# Patient Record
Sex: Female | Born: 1955 | Race: White | Hispanic: No | Marital: Married | Smoking: Never smoker
Health system: Southern US, Community
[De-identification: ages and names within clinical notes are randomized; demographics above are authoritative.]

## PROBLEM LIST (undated history)

## (undated) DIAGNOSIS — F419 Anxiety disorder, unspecified: Secondary | ICD-10-CM

## (undated) DIAGNOSIS — I2510 Atherosclerotic heart disease of native coronary artery without angina pectoris: Secondary | ICD-10-CM

## (undated) DIAGNOSIS — K219 Gastro-esophageal reflux disease without esophagitis: Secondary | ICD-10-CM

## (undated) DIAGNOSIS — E785 Hyperlipidemia, unspecified: Secondary | ICD-10-CM

## (undated) HISTORY — DX: Atherosclerotic heart disease of native coronary artery without angina pectoris: I25.10

## (undated) HISTORY — PX: CORONARY ARTERY BYPASS GRAFT: SHX141

## (undated) HISTORY — DX: Hyperlipidemia, unspecified: E78.5

## (undated) HISTORY — PX: OTHER SURGICAL HISTORY: SHX169

## (undated) HISTORY — PX: WISDOM TOOTH EXTRACTION: SHX21

## (undated) HISTORY — DX: Anxiety disorder, unspecified: F41.9

## (undated) HISTORY — DX: Gastro-esophageal reflux disease without esophagitis: K21.9

---

## 1998-07-10 ENCOUNTER — Other Ambulatory Visit: Admission: RE | Admit: 1998-07-10 | Discharge: 1998-07-10 | Payer: Self-pay | Admitting: Gynecology

## 1999-07-14 ENCOUNTER — Other Ambulatory Visit: Admission: RE | Admit: 1999-07-14 | Discharge: 1999-07-14 | Payer: Self-pay | Admitting: Gynecology

## 2000-08-12 ENCOUNTER — Other Ambulatory Visit: Admission: RE | Admit: 2000-08-12 | Discharge: 2000-08-12 | Payer: Self-pay | Admitting: Gynecology

## 2001-06-02 ENCOUNTER — Other Ambulatory Visit: Admission: RE | Admit: 2001-06-02 | Discharge: 2001-06-02 | Payer: Self-pay | Admitting: Gynecology

## 2001-06-11 HISTORY — PX: TUBAL LIGATION: SHX77

## 2002-07-05 ENCOUNTER — Other Ambulatory Visit: Admission: RE | Admit: 2002-07-05 | Discharge: 2002-07-05 | Payer: Self-pay | Admitting: Gynecology

## 2003-10-18 ENCOUNTER — Other Ambulatory Visit: Admission: RE | Admit: 2003-10-18 | Discharge: 2003-10-18 | Payer: Self-pay | Admitting: Gynecology

## 2005-01-11 HISTORY — PX: ROTATOR CUFF REPAIR: SHX139

## 2005-01-19 ENCOUNTER — Encounter: Admission: RE | Admit: 2005-01-19 | Discharge: 2005-01-19 | Payer: Self-pay | Admitting: Specialist

## 2005-02-04 ENCOUNTER — Ambulatory Visit: Payer: Self-pay | Admitting: Gastroenterology

## 2005-02-05 ENCOUNTER — Ambulatory Visit (HOSPITAL_COMMUNITY): Admission: RE | Admit: 2005-02-05 | Discharge: 2005-02-05 | Payer: Self-pay | Admitting: Gastroenterology

## 2005-10-01 ENCOUNTER — Other Ambulatory Visit: Admission: RE | Admit: 2005-10-01 | Discharge: 2005-10-01 | Payer: Self-pay | Admitting: Gynecology

## 2006-01-12 ENCOUNTER — Ambulatory Visit: Payer: Self-pay | Admitting: Gastroenterology

## 2006-01-14 ENCOUNTER — Ambulatory Visit: Payer: Self-pay | Admitting: Cardiology

## 2006-01-15 ENCOUNTER — Ambulatory Visit: Payer: Self-pay | Admitting: Cardiology

## 2006-01-15 ENCOUNTER — Encounter: Payer: Self-pay | Admitting: Vascular Surgery

## 2006-01-15 ENCOUNTER — Inpatient Hospital Stay (HOSPITAL_COMMUNITY): Admission: AD | Admit: 2006-01-15 | Discharge: 2006-01-23 | Payer: Self-pay | Admitting: Cardiology

## 2006-01-15 ENCOUNTER — Inpatient Hospital Stay (HOSPITAL_BASED_OUTPATIENT_CLINIC_OR_DEPARTMENT_OTHER): Admission: RE | Admit: 2006-01-15 | Discharge: 2006-01-15 | Payer: Self-pay | Admitting: Cardiology

## 2006-02-08 ENCOUNTER — Ambulatory Visit: Payer: Self-pay | Admitting: Cardiology

## 2006-02-11 ENCOUNTER — Encounter (HOSPITAL_COMMUNITY): Admission: RE | Admit: 2006-02-11 | Discharge: 2006-05-12 | Payer: Self-pay | Admitting: Cardiology

## 2006-02-11 ENCOUNTER — Encounter: Admission: RE | Admit: 2006-02-11 | Discharge: 2006-02-11 | Payer: Self-pay | Admitting: Cardiothoracic Surgery

## 2006-02-25 ENCOUNTER — Ambulatory Visit: Payer: Self-pay | Admitting: Cardiology

## 2006-03-12 ENCOUNTER — Ambulatory Visit: Payer: Self-pay | Admitting: Cardiology

## 2006-05-14 ENCOUNTER — Encounter (HOSPITAL_COMMUNITY): Admission: RE | Admit: 2006-05-14 | Discharge: 2006-06-25 | Payer: Self-pay | Admitting: Cardiology

## 2006-05-21 ENCOUNTER — Ambulatory Visit: Payer: Self-pay | Admitting: Cardiology

## 2006-06-21 ENCOUNTER — Ambulatory Visit: Payer: Self-pay | Admitting: Internal Medicine

## 2006-06-21 LAB — CONVERTED CEMR LAB
ALT: 16 units/L (ref 0–40)
AST: 19 units/L (ref 0–37)
Albumin: 3.7 g/dL (ref 3.5–5.2)
Alkaline Phosphatase: 65 units/L (ref 39–117)
BUN: 6 mg/dL (ref 6–23)
Basophils Absolute: 0 10*3/uL (ref 0.0–0.1)
Basophils Relative: 0.7 % (ref 0.0–1.0)
Bilirubin Urine: NEGATIVE
Bilirubin, Direct: 0.1 mg/dL (ref 0.0–0.3)
CO2: 29 meq/L (ref 19–32)
Calcium: 9.1 mg/dL (ref 8.4–10.5)
Chloride: 107 meq/L (ref 96–112)
Cholesterol: 133 mg/dL (ref 0–200)
Creatinine, Ser: 0.8 mg/dL (ref 0.4–1.2)
Crystals: NEGATIVE
Direct LDL: 39.1 mg/dL
Eosinophils Absolute: 0.1 10*3/uL (ref 0.0–0.6)
Eosinophils Relative: 1.2 % (ref 0.0–5.0)
GFR calc Af Amer: 98 mL/min
GFR calc non Af Amer: 81 mL/min
Glucose, Bld: 103 mg/dL — ABNORMAL HIGH (ref 70–99)
HCT: 40.5 % (ref 36.0–46.0)
HDL: 39.6 mg/dL (ref >39.0)
Hemoglobin: 14.2 g/dL (ref 12.0–15.0)
Ketones, ur: NEGATIVE mg/dL
Lymphocytes Relative: 25.7 % (ref 12.0–46.0)
MCHC: 35.2 g/dL (ref 30.0–36.0)
MCV: 86.2 fL (ref 78.0–100.0)
Monocytes Absolute: 0.4 10*3/uL (ref 0.2–0.7)
Monocytes Relative: 5.8 % (ref 3.0–11.0)
Mucus, UA: NEGATIVE
Neutro Abs: 4.2 10*3/uL (ref 1.4–7.7)
Neutrophils Relative %: 66.6 % (ref 43.0–77.0)
Nitrite: NEGATIVE
Platelets: 178 10*3/uL (ref 150–400)
Potassium: 4.4 meq/L (ref 3.5–5.1)
RBC: 4.7 M/uL (ref 3.87–5.11)
RDW: 15.9 % — ABNORMAL HIGH (ref 11.5–14.6)
Sodium: 142 meq/L (ref 135–145)
Specific Gravity, Urine: >=1.03 (ref 1.000–1.03)
TSH: 1.23 microintl units/mL (ref 0.35–5.50)
Total Bilirubin: 0.7 mg/dL (ref 0.3–1.2)
Total CHOL/HDL Ratio: 3.4
Total Protein: 6.5 g/dL (ref 6.0–8.3)
Triglycerides: 222 mg/dL (ref 0–149)
Urine Glucose: NEGATIVE mg/dL
Urobilinogen, UA: 0.2 (ref 0.0–1.0)
VLDL: 44 mg/dL — ABNORMAL HIGH (ref 0–40)
WBC: 6.3 10*3/uL (ref 4.5–10.5)
pH: 5.5 (ref 5.0–8.0)

## 2006-06-24 ENCOUNTER — Ambulatory Visit: Payer: Self-pay | Admitting: Internal Medicine

## 2006-09-15 ENCOUNTER — Other Ambulatory Visit: Admission: RE | Admit: 2006-09-15 | Discharge: 2006-09-15 | Payer: Self-pay | Admitting: Gynecology

## 2006-11-09 ENCOUNTER — Ambulatory Visit (HOSPITAL_COMMUNITY): Admission: RE | Admit: 2006-11-09 | Discharge: 2006-11-09 | Payer: Self-pay | Admitting: Gynecology

## 2006-11-23 ENCOUNTER — Ambulatory Visit: Payer: Self-pay | Admitting: Cardiology

## 2006-12-22 ENCOUNTER — Ambulatory Visit (HOSPITAL_COMMUNITY): Admission: RE | Admit: 2006-12-22 | Discharge: 2006-12-22 | Payer: Self-pay | Admitting: Gynecology

## 2007-01-10 ENCOUNTER — Ambulatory Visit: Payer: Self-pay | Admitting: Cardiology

## 2007-01-10 LAB — CONVERTED CEMR LAB: Total CK: 96 units/L (ref 7–177)

## 2007-02-12 ENCOUNTER — Encounter: Payer: Self-pay | Admitting: *Deleted

## 2007-02-12 DIAGNOSIS — Z951 Presence of aortocoronary bypass graft: Secondary | ICD-10-CM | POA: Insufficient documentation

## 2007-02-12 DIAGNOSIS — Z9189 Other specified personal risk factors, not elsewhere classified: Secondary | ICD-10-CM | POA: Insufficient documentation

## 2007-02-12 DIAGNOSIS — E785 Hyperlipidemia, unspecified: Secondary | ICD-10-CM

## 2007-02-12 DIAGNOSIS — K219 Gastro-esophageal reflux disease without esophagitis: Secondary | ICD-10-CM

## 2007-02-12 DIAGNOSIS — I2510 Atherosclerotic heart disease of native coronary artery without angina pectoris: Secondary | ICD-10-CM | POA: Insufficient documentation

## 2007-02-12 DIAGNOSIS — Z9889 Other specified postprocedural states: Secondary | ICD-10-CM | POA: Insufficient documentation

## 2007-02-12 HISTORY — DX: Hyperlipidemia, unspecified: E78.5

## 2007-02-12 HISTORY — DX: Gastro-esophageal reflux disease without esophagitis: K21.9

## 2007-03-31 ENCOUNTER — Ambulatory Visit: Payer: Self-pay | Admitting: Cardiology

## 2007-05-13 ENCOUNTER — Ambulatory Visit (HOSPITAL_COMMUNITY): Admission: RE | Admit: 2007-05-13 | Discharge: 2007-05-13 | Payer: Self-pay | Admitting: Gynecology

## 2007-05-18 ENCOUNTER — Ambulatory Visit: Payer: Self-pay | Admitting: Gastroenterology

## 2007-05-18 LAB — CONVERTED CEMR LAB
ALT: 13 units/L (ref 0–35)
AST: 17 units/L (ref 0–37)
Albumin: 3.9 g/dL (ref 3.5–5.2)
Alkaline Phosphatase: 70 units/L (ref 39–117)
Amylase: 32 units/L (ref 27–131)
Basophils Absolute: 0.1 10*3/uL (ref 0.0–0.1)
Basophils Relative: 0.9 % (ref 0.0–1.0)
Bilirubin, Direct: 0.1 mg/dL (ref 0.0–0.3)
Eosinophils Absolute: 0.1 10*3/uL (ref 0.0–0.6)
Eosinophils Relative: 1.7 % (ref 0.0–5.0)
HCT: 43.4 % (ref 36.0–46.0)
Hemoglobin: 14.1 g/dL (ref 12.0–15.0)
Lymphocytes Relative: 26.5 % (ref 12.0–46.0)
MCHC: 32.5 g/dL (ref 30.0–36.0)
MCV: 91.5 fL (ref 78.0–100.0)
Monocytes Absolute: 0.3 10*3/uL (ref 0.2–0.7)
Monocytes Relative: 6 % (ref 3.0–11.0)
Neutro Abs: 3.7 10*3/uL (ref 1.4–7.7)
Neutrophils Relative %: 64.9 % (ref 43.0–77.0)
Platelets: 149 10*3/uL — ABNORMAL LOW (ref 150–400)
RBC: 4.74 M/uL (ref 3.87–5.11)
RDW: 12.7 % (ref 11.5–14.6)
Total Bilirubin: 0.8 mg/dL (ref 0.3–1.2)
Total Protein: 6.9 g/dL (ref 6.0–8.3)
WBC: 5.7 10*3/uL (ref 4.5–10.5)

## 2007-05-20 ENCOUNTER — Ambulatory Visit (HOSPITAL_COMMUNITY): Admission: RE | Admit: 2007-05-20 | Discharge: 2007-05-20 | Payer: Self-pay | Admitting: Gastroenterology

## 2007-05-31 ENCOUNTER — Ambulatory Visit: Payer: Self-pay | Admitting: Cardiology

## 2007-05-31 LAB — CONVERTED CEMR LAB
ALT: 22 units/L (ref 0–35)
AST: 20 units/L (ref 0–37)
Albumin: 3.7 g/dL (ref 3.5–5.2)
Alkaline Phosphatase: 61 units/L (ref 39–117)
Bilirubin, Direct: 0.1 mg/dL (ref 0.0–0.3)
Cholesterol: 264 mg/dL (ref 0–200)
Direct LDL: 66.2 mg/dL
HDL: 27.8 mg/dL — ABNORMAL LOW (ref >39.0)
Total Bilirubin: 0.8 mg/dL (ref 0.3–1.2)
Total CHOL/HDL Ratio: 9.5
Total Protein: 4.9 g/dL — ABNORMAL LOW (ref 6.0–8.3)
Triglycerides: 613 mg/dL (ref 0–149)
VLDL: 123 mg/dL — ABNORMAL HIGH (ref 0–40)

## 2007-06-01 ENCOUNTER — Ambulatory Visit: Payer: Self-pay | Admitting: Gastroenterology

## 2007-06-24 ENCOUNTER — Telehealth: Payer: Self-pay | Admitting: Internal Medicine

## 2007-06-30 ENCOUNTER — Ambulatory Visit: Payer: Self-pay | Admitting: Cardiology

## 2007-06-30 ENCOUNTER — Ambulatory Visit: Payer: Self-pay | Admitting: Internal Medicine

## 2007-06-30 DIAGNOSIS — R7989 Other specified abnormal findings of blood chemistry: Secondary | ICD-10-CM | POA: Insufficient documentation

## 2007-06-30 LAB — CONVERTED CEMR LAB: Glucose, Bld: 87 mg/dL (ref 70–99)

## 2007-07-01 ENCOUNTER — Ambulatory Visit (HOSPITAL_COMMUNITY): Admission: RE | Admit: 2007-07-01 | Discharge: 2007-07-01 | Payer: Self-pay | Admitting: Gynecology

## 2007-07-02 ENCOUNTER — Encounter: Payer: Self-pay | Admitting: Internal Medicine

## 2007-07-05 ENCOUNTER — Ambulatory Visit: Payer: Self-pay | Admitting: Gastroenterology

## 2007-07-12 ENCOUNTER — Ambulatory Visit: Payer: Self-pay | Admitting: Gastroenterology

## 2007-08-19 ENCOUNTER — Ambulatory Visit: Payer: Self-pay | Admitting: Cardiology

## 2007-08-19 LAB — CONVERTED CEMR LAB
ALT: 17 units/L (ref 0–35)
AST: 19 units/L (ref 0–37)
Albumin: 3.7 g/dL (ref 3.5–5.2)
Alkaline Phosphatase: 52 units/L (ref 39–117)
BUN: 11 mg/dL (ref 6–23)
CO2: 27 meq/L (ref 19–32)
Calcium: 9.2 mg/dL (ref 8.4–10.5)
Chloride: 109 meq/L (ref 96–112)
Cholesterol: 115 mg/dL (ref 0–200)
Creatinine, Ser: 1 mg/dL (ref 0.4–1.2)
GFR calc Af Amer: 76 mL/min
GFR calc non Af Amer: 63 mL/min
Glucose, Bld: 111 mg/dL — ABNORMAL HIGH (ref 70–99)
HDL: 29.2 mg/dL — ABNORMAL LOW (ref >39.0)
LDL Cholesterol: 48 mg/dL (ref 0–99)
Potassium: 4.1 meq/L (ref 3.5–5.1)
Sodium: 143 meq/L (ref 135–145)
Total Bilirubin: 1 mg/dL (ref 0.3–1.2)
Total CHOL/HDL Ratio: 3.9
Total Protein: 6.5 g/dL (ref 6.0–8.3)
Triglycerides: 189 mg/dL — ABNORMAL HIGH (ref 0–149)
VLDL: 38 mg/dL (ref 0–40)

## 2007-08-25 ENCOUNTER — Ambulatory Visit: Payer: Self-pay | Admitting: Internal Medicine

## 2007-10-01 IMAGING — US US PELVIS COMPLETE MODIFY
1 series · 13 of 25 positions shown · non-contrast
Comparison: None.

CLINICAL DATA: Dysfunctional uterine bleeding.  Endometriosis.
 TRANSABDOMINAL AND TRANSVAGINAL PELVIC ULTRASOUND ? 11/09/06:
TECHNIQUE: Both transabdominal and transvaginal ultrasound examinations of the pelvis were performed including evaluation of the uterus, ovaries, adnexal regions, and pelvic cul-de-sac. Gray-scale and color flow Doppler evaluation was performed.

[Series 1: us pelvis complete modify · 0.35mm/px · 13 of 44 slices shown]
[im 1/44]
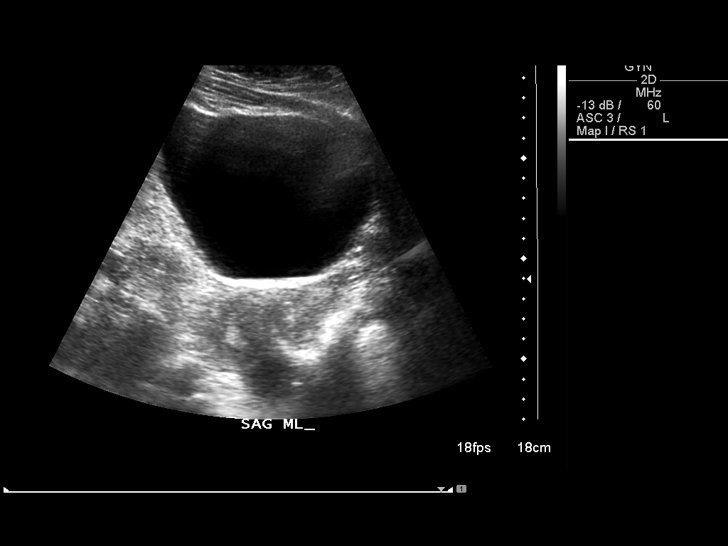
[im 4/44]
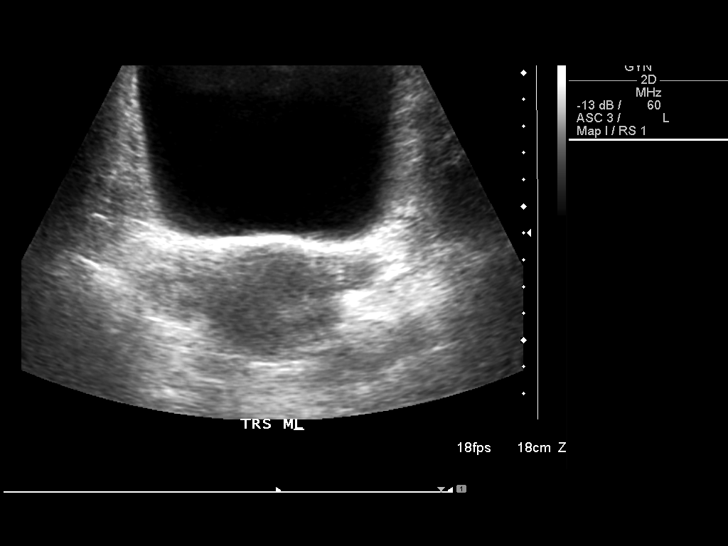
[im 8/44]
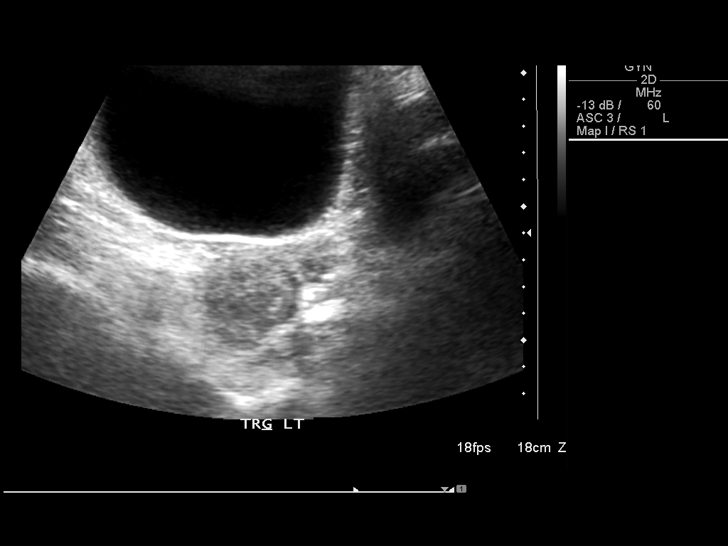
[im 11/44]
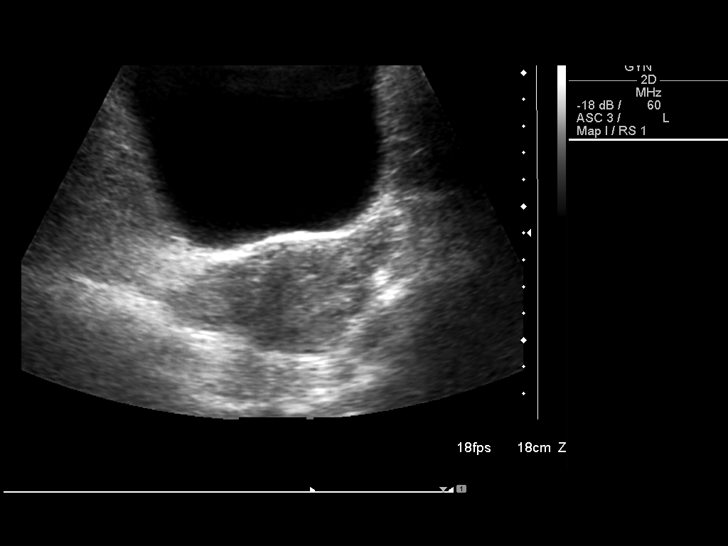
[im 15/44]
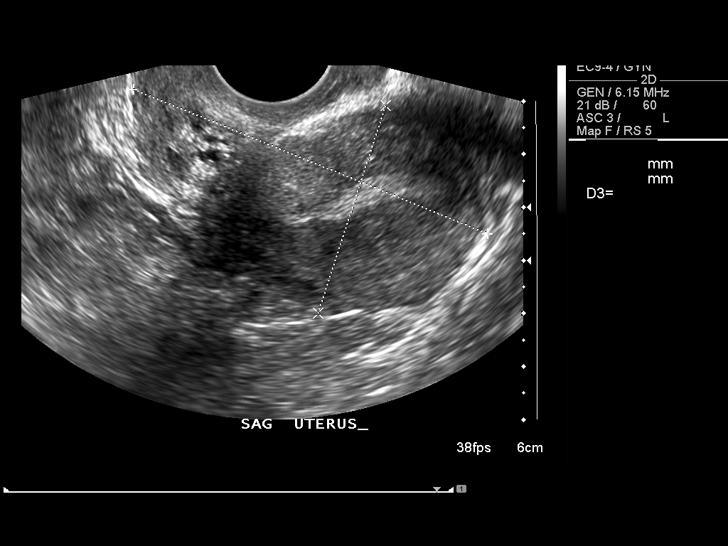
[im 18/44]
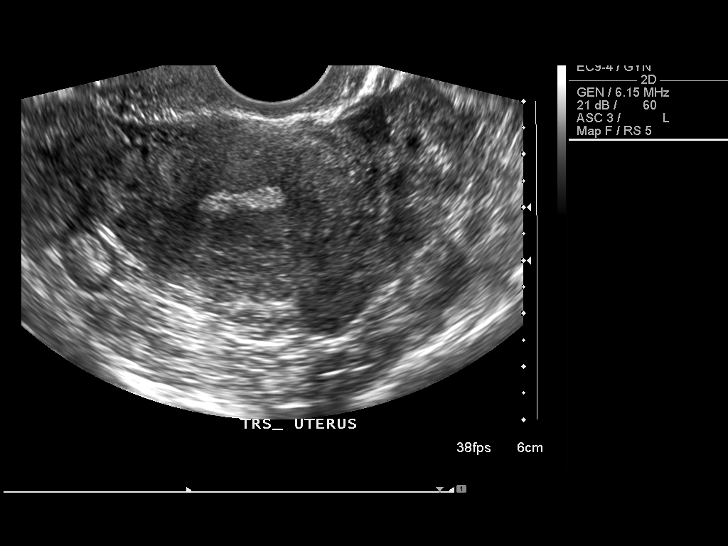
[im 22/44]
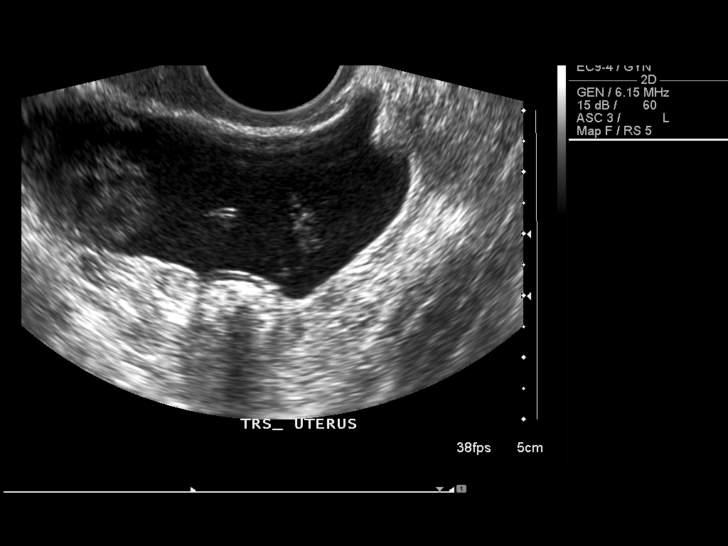
[im 26/44]
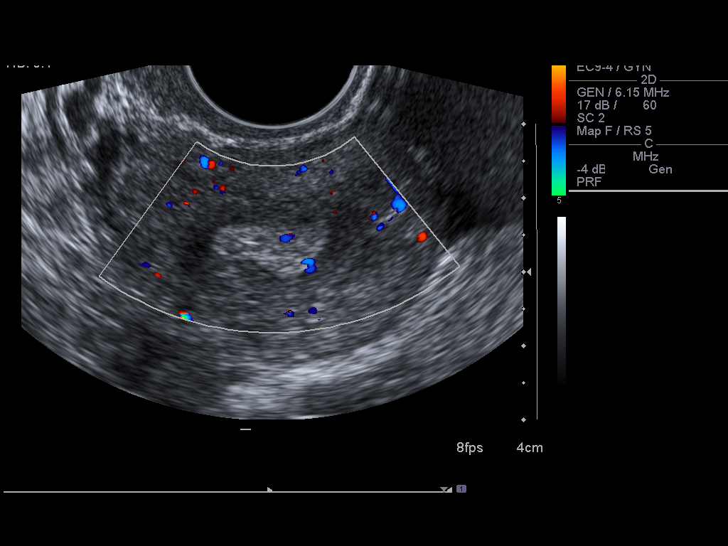
[im 29/44]
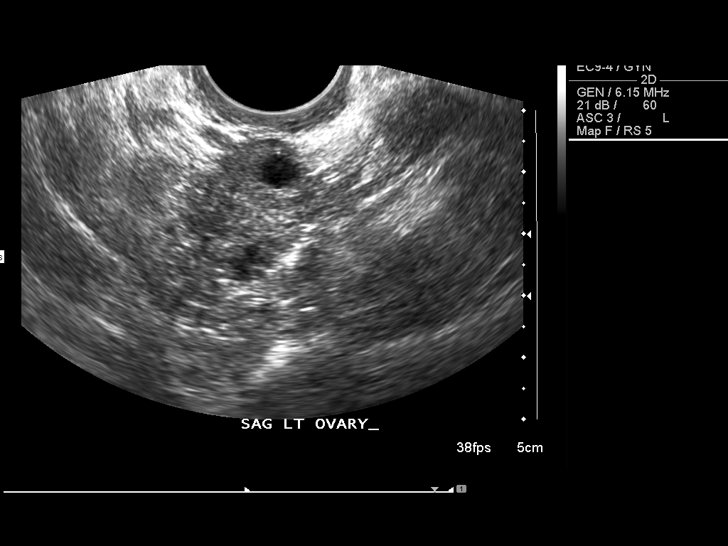
[im 33/44]
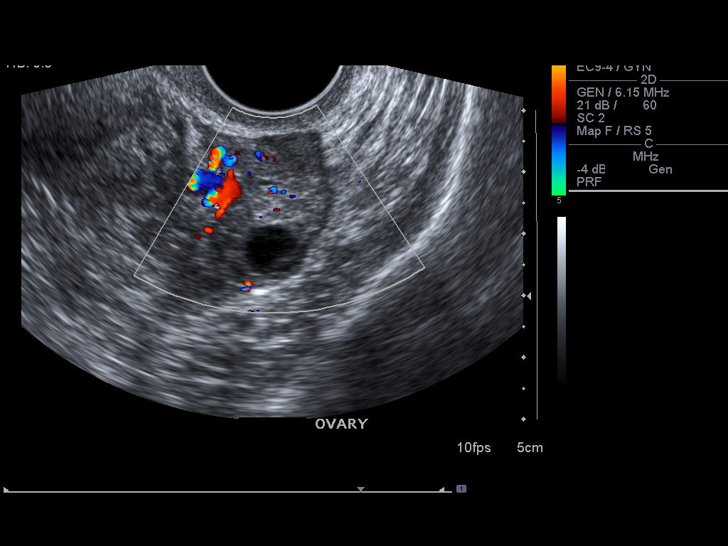
[im 36/44]
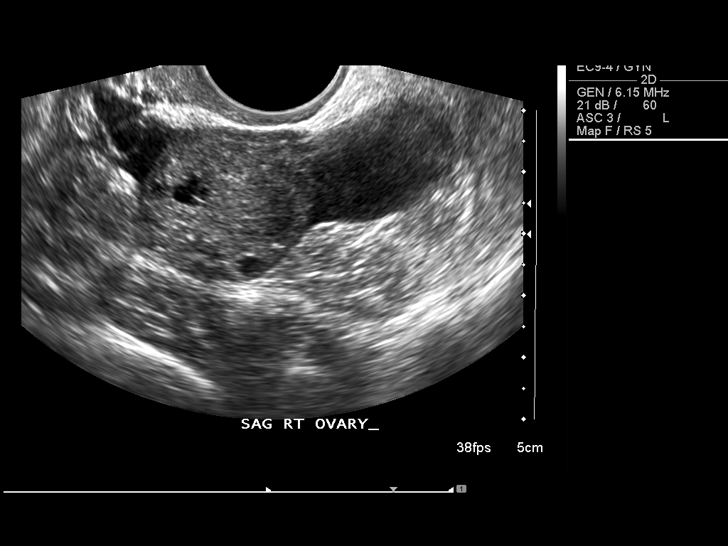
[im 40/44]
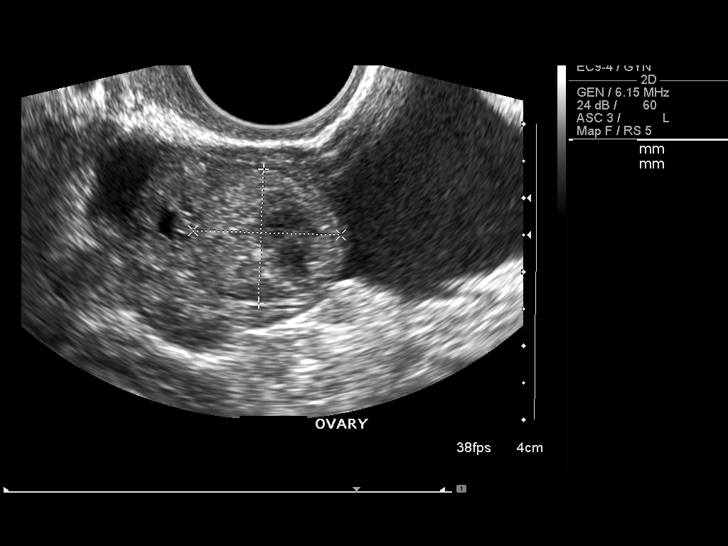
[im 44/44]
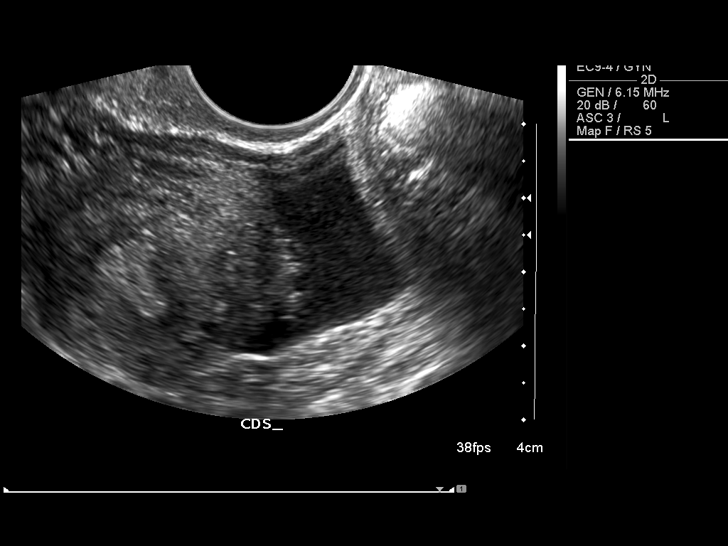

[13 of 25 positions shown; findings below may reference images not displayed]

FINDINGS: The uterus is retroverted and retroflexed.  The endometrial stripe is uniformly echogenic, measuring 9 mm.  A 1.6 x 1.6 x 1.0 cm left posterior fundal fibroid (partly subserosal, partly intramural) is present.  There is no appreciable mass effect upon the endometrial stripe.  
 The left ovary is normal.  There is a 2.0 x 1.8 x 1.6 cm thick-walled right ovarian cyst with internal echoes.  Moderate free fluid is noted in the pelvis with internal echoes present.
IMPRESSION: 1.6 cm fundal fibroid without appreciable mass effect upon the submucosa to explain the history of dysfunctional uterine bleeding.
 Thick-walled right ovarian cyst which, in the presence of complex free fluid, may signify a ruptured/hemorrhagic cyst.  Alternatively, this free fluid may be related to the patient?s history of endometriosis.

## 2007-11-21 ENCOUNTER — Ambulatory Visit: Payer: Self-pay | Admitting: Cardiology

## 2007-12-05 ENCOUNTER — Ambulatory Visit: Payer: Self-pay | Admitting: Cardiology

## 2007-12-05 LAB — CONVERTED CEMR LAB
ALT: 13 units/L (ref 0–35)
AST: 16 units/L (ref 0–37)
Albumin: 3.7 g/dL (ref 3.5–5.2)
Alkaline Phosphatase: 48 units/L (ref 39–117)
Bilirubin, Direct: 0.1 mg/dL (ref 0.0–0.3)
Cholesterol: 127 mg/dL (ref 0–200)
Direct LDL: 42.5 mg/dL
Glucose, Bld: 102 mg/dL — ABNORMAL HIGH (ref 70–99)
HDL: 26.8 mg/dL — ABNORMAL LOW (ref >39.0)
Total Bilirubin: 0.6 mg/dL (ref 0.3–1.2)
Total CHOL/HDL Ratio: 4.7
Total Protein: 6.6 g/dL (ref 6.0–8.3)
Triglycerides: 246 mg/dL (ref 0–149)
VLDL: 49 mg/dL — ABNORMAL HIGH (ref 0–40)

## 2007-12-08 ENCOUNTER — Ambulatory Visit: Payer: Self-pay | Admitting: Cardiovascular Disease

## 2008-05-08 ENCOUNTER — Other Ambulatory Visit: Admission: RE | Admit: 2008-05-08 | Discharge: 2008-05-08 | Payer: Self-pay | Admitting: Gynecology

## 2008-05-11 ENCOUNTER — Ambulatory Visit: Payer: Self-pay | Admitting: Cardiology

## 2008-05-11 LAB — CONVERTED CEMR LAB
ALT: 33 units/L (ref 0–35)
AST: 28 units/L (ref 0–37)
Albumin: 3.5 g/dL (ref 3.5–5.2)
Alkaline Phosphatase: 44 units/L (ref 39–117)
Bilirubin, Direct: 0.1 mg/dL (ref 0.0–0.3)
Cholesterol: 135 mg/dL (ref 0–200)
HDL: 42 mg/dL (ref >39.0)
LDL Cholesterol: 67 mg/dL (ref 0–99)
Total Bilirubin: 0.6 mg/dL (ref 0.3–1.2)
Total CHOL/HDL Ratio: 3.2
Total Protein: 7 g/dL (ref 6.0–8.3)
Triglycerides: 128 mg/dL (ref 0–149)
VLDL: 26 mg/dL (ref 0–40)

## 2008-05-14 ENCOUNTER — Ambulatory Visit: Payer: Self-pay | Admitting: Cardiology

## 2008-07-30 ENCOUNTER — Telehealth: Payer: Self-pay | Admitting: Cardiology

## 2008-11-08 ENCOUNTER — Ambulatory Visit: Payer: Self-pay | Admitting: Cardiology

## 2008-11-12 ENCOUNTER — Ambulatory Visit: Payer: Self-pay | Admitting: Internal Medicine

## 2008-11-16 LAB — CONVERTED CEMR LAB
ALT: 15 units/L (ref 0–35)
AST: 18 units/L (ref 0–37)
Albumin: 4 g/dL (ref 3.5–5.2)
Alkaline Phosphatase: 36 units/L — ABNORMAL LOW (ref 39–117)
Bilirubin, Direct: 0.2 mg/dL (ref 0.0–0.3)
Cholesterol: 133 mg/dL (ref 0–200)
HDL: 41.9 mg/dL (ref >39.00)
LDL Cholesterol: 70 mg/dL (ref 0–99)
Total Bilirubin: 0.9 mg/dL (ref 0.3–1.2)
Total CHOL/HDL Ratio: 3
Total Protein: 6.9 g/dL (ref 6.0–8.3)
Triglycerides: 105 mg/dL (ref 0.0–149.0)
VLDL: 21 mg/dL (ref 0.0–40.0)

## 2008-12-14 ENCOUNTER — Ambulatory Visit: Payer: Self-pay | Admitting: Cardiology

## 2009-05-15 ENCOUNTER — Ambulatory Visit: Payer: Self-pay | Admitting: Cardiology

## 2009-05-15 ENCOUNTER — Encounter (INDEPENDENT_AMBULATORY_CARE_PROVIDER_SITE_OTHER): Payer: Self-pay | Admitting: *Deleted

## 2009-05-15 LAB — CONVERTED CEMR LAB
ALT: 23 units/L (ref 0–35)
AST: 27 units/L (ref 0–37)
Albumin: 4.1 g/dL (ref 3.5–5.2)
Alkaline Phosphatase: 42 units/L (ref 39–117)
Bilirubin, Direct: 0.2 mg/dL (ref 0.0–0.3)
Cholesterol: 128 mg/dL (ref 0–200)
Glucose, Bld: 89 mg/dL (ref 70–99)
HDL: 52.9 mg/dL (ref >39.00)
LDL Cholesterol: 49 mg/dL (ref 0–99)
Total Bilirubin: 0.6 mg/dL (ref 0.3–1.2)
Total CHOL/HDL Ratio: 2
Total Protein: 6.9 g/dL (ref 6.0–8.3)
Triglycerides: 131 mg/dL (ref 0.0–149.0)
VLDL: 26.2 mg/dL (ref 0.0–40.0)

## 2009-05-16 ENCOUNTER — Ambulatory Visit: Payer: Self-pay | Admitting: Cardiology

## 2009-06-19 ENCOUNTER — Telehealth: Payer: Self-pay | Admitting: Cardiology

## 2009-08-22 ENCOUNTER — Ambulatory Visit: Payer: Self-pay | Admitting: Internal Medicine

## 2009-08-22 LAB — CONVERTED CEMR LAB
ALT: 17 units/L (ref 0–35)
AST: 24 units/L (ref 0–37)
Albumin: 4.2 g/dL (ref 3.5–5.2)
Alkaline Phosphatase: 39 units/L (ref 39–117)
Bilirubin, Direct: 0.1 mg/dL (ref 0.0–0.3)
Total Bilirubin: 0.5 mg/dL (ref 0.3–1.2)
Total CK: 108 units/L (ref 7–177)
Total Protein: 6.7 g/dL (ref 6.0–8.3)

## 2009-10-10 ENCOUNTER — Telehealth: Payer: Self-pay | Admitting: Cardiology

## 2010-05-04 ENCOUNTER — Encounter: Payer: Self-pay | Admitting: Gynecology

## 2010-05-05 ENCOUNTER — Encounter: Payer: Self-pay | Admitting: Gynecology

## 2010-05-15 NOTE — Progress Notes (Signed)
Summary: refill request  Phone Note Refill Request Message from:  Patient on October 10, 2009 11:55 AM  Refills Requested: Medication #1:  LIPITOR 10 MG TABS Take 1 tablet by mouth once a day [facility] [address]   Method Requested: Telephone to Pharmacy Initial call taken by: Glynda Jaeger,  October 10, 2009 11:56 AM  Follow-up for Phone Call        Spoke with pt about a month ago.  Muscle pain was no different without taking Lipitor.  Has seen MD and think pain is related to arthritis rather than medications.  She was going to restart her Lipitor at that time.  Rx sent in.  Follow-up by: Weston Brass PharmD,  October 10, 2009 2:04 PM

## 2010-05-15 NOTE — Letter (Signed)
Summary: Custom - Lipid  [facility], Main Office  [address]   [place], [place] [address]   Phone: [phone]  Fax: [phone]         May 15, 2009 MRN: [id number]   [name] [address] [place], [place]  [address]   Dear Ms. Keeling,  We have reviewed your cholesterol results.  They are as follows:     Total Cholesterol:    128 (Desirable: less than 200)       HDL  Cholesterol:     52.90  (Desirable: greater than 40 for men and 50 for women)       LDL Cholesterol:       49  (Desirable: less than 100 for low risk and less than 70 for moderate to high risk)       Triglycerides:       131.0  (Desirable: less than 150)  Our recommendations include: No change, this is excellent!   Call our office at the number listed above if you have any questions.  Lowering your LDL cholesterol is important, but it is only one of a large number of "risk factors" that may indicate that you are at risk for heart disease, stroke or other complications of hardening of the arteries.  Other risk factors include:   A.  Cigarette Smoking* B.  High Blood Pressure* C.  Obesity* D.   Low HDL Cholesterol (see yours above)* E.   Diabetes Mellitus (higher risk if your is uncontrolled) F.  Family history of premature heart disease G.  Previous history of stroke or cardiovascular disease           *These are risk factors YOU HAVE CONTROL OVER.  For more information, visit .  There is now evidence that lowering the TOTAL CHOLESTEROL AND LDL CHOLESTEROL can reduce the risk of heart disease.  The American Heart Association recommends the following guidelines for the treatment of elevated cholesterol:  1.  If there is now current heart disease and less than two risk factors, TOTAL CHOLESTEROL should be less than 200 and LDL CHOLESTEROL should be less than 100. 2.  If there is current heart disease or two or more risk factors, TOTAL CHOLESTEROL should  be less than 200 and LDL CHOLESTEROL should be less than 70.  A diet low in cholesterol, saturated fat, and calories is the cornerstone of treatment for elevated cholesterol.  Cessation of smoking and exercise are also important in the management of elevated cholesterol and preventing vascular disease.  Studies have shown that 30 to 60 minutes of physical activity most days can help lower blood pressure, lower cholesterol, and keep your weight at a healthy level.  Drug therapy is used when cholesterol levels do not respond to therapeutic lifestyle changes (smoking cessation, diet, and exercise) and remains unacceptably high.  If medication is started, it is important to have you levels checked periodically to evaluate the need for further treatment options.  Thank you,   Dr Rollene Rotunda, MD/Euleta Belson Volney Presser, RN [facility] Team

## 2010-05-15 NOTE — Assessment & Plan Note (Signed)
Summary: rov.[name]   Primary Provider:  Dr. Debby Bud  CC:  dyslipidemia follow-up.  History of Present Illness:  Lipid Clinic Visit      [name] comes in today for 6 mo dyslipidemia follow-up.  Her current hyperlipidemia regimen includes fenofibrate, fish oil, and Lipitor.  She is compliant and reports tolerance to all medications. Dietary compliance review reveals an overall grade of A.  She reports eating very little fast food, and eating mainly chicken and fish, salad, soups, and at least 2-3 servings of vegetables daily.  [name] chooses low-fat dairy products and drinks mainly water with an occasional soda.  Review of exercise habits reveals that the patient uses a treadmill or row-machine at least 3-5 times a week, for 30-60 minutes.      See above note regarding [name] progress and HPI.  Lipid Management Provider  [name], PharmD  Allergies: 1)  ! Codeine   Vital Signs:  Patient profile:   55 year old female BP sitting:   118 / 78  Impression & Recommendations:  Problem # 1:  DYSLIPIDEMIA (ICD-272.4) Most recent lipid panel results are as follows: TC 128 (goal <200) TG 131 (goal <150) HDL 52.9 (goal >50) LDL 49 (goal <100)  [name] has reached all lipid goals at this time.  She is compliant with medications and currently reports no adverse events.  She eats a regular and healthy diet, cosisting of low-fat food choices.  She also exercises routinely and will continue this.    Plan:  No changes at this time.  [name] will continue her current course of therapy as well as diet and exercise routines.  Follow-up has been scheduled for 6 months (august).   Her updated medication list for this problem includes:    Fenofibrate 160 Mg Tabs (Fenofibrate) .Marland Kitchen... 1 by mouth daily    Lipitor 10 Mg Tabs (Atorvastatin calcium) .Marland Kitchen... Take 1 tablet by mouth once a day

## 2010-05-15 NOTE — Assessment & Plan Note (Signed)
Summary: per pt call/having lots pain in hands,hips,joints and sometim...   Visit Type:  Follow-up  CC:  dyslipidemia follow-up.   Lipid Clinic Visit      The patient comes in today complaining of muscle aches and muscle cramps.  She states the pain is usually in the morning and occurs in her hips and feet.  She also mentioned her hands will hurt after she plays the piano.  Her mother has a history of rheumatoid arthritis.  The pain was described both as joint pain but also as aches and pain in her thigh muscles.  She does not report any dark urine at this time.  She did mention that she is premenopausal.  She did not have a mestrual cycle from September to February and did not have any problems.  Since February she has had some spotting and mestrual cycle like symptoms and wonders if the pain could be related to this.  She has an appt with her OB-GYN in the near future.  Her current cholesterol medications incule fenofibrate, fish oil, and Lipitor.  Of note, she has been on Lipitor since October 2007.   Lipid Management Provider  Weston Brass, PharmD  Current Medications (verified): 1)  Metoprolol Tartrate 25 Mg  Tabs (Metoprolol Tartrate) .... Take One Tablet Twice Daily 2)  Fenofibrate 160 Mg Tabs (Fenofibrate) .Marland Kitchen.. 1 By Mouth Daily 3)  Aspirin 81 Mg  Tabs (Aspirin) .... Take One Tablet Once Daily 4)  Centrum   Tabs (Multiple Vitamins-Minerals) .... Take 1 Tablet Daily 5)  Zegerid 40-1100 Mg  Caps (Omeprazole-Sodium Bicarbonate) .... Take When Needed and As Directed 6)  Lipitor 10 Mg Tabs (Atorvastatin Calcium) .... Take 1 Tablet By Mouth Once A Day 7)  Ra Fish Oil 1000 Mg Caps (Omega-3 Fatty Acids) .... 8 Capsules Daily.  Allergies: 1)  ! Codeine  Past History:  Past Medical History: Last updated: 12/13/2008 GASTROESOPHAGEAL REFLUX DISEASE (ICD-530.81) DYSLIPIDEMIA (ICD-272.4) CORONARY ARTERY DISEASE (ICD-414.00) FH Colon Ca     Impression & Recommendations:  Problem # 1:   DYSLIPIDEMIA (ICD-272.4) Assessment Comment Only Based on the history given, unsure if patient's muscle pains are related to her cholesterol medications.  She has been on her current therapy for years and never had any issues.  It is possible for both Lipitor and fenofibrate to be responsible for the pain and the combination may result in a higher risk for the muscle pain.  She does have a family history of arthritis and is currently perimenopausal.    For now, will stop her Lipitor and check in 2-3 weeks to determine if her pain is better.  If pain is unchanged, will plan to resume Lipitor and d/c fenofibrate.  Would really like to keep her on a statin given her history of CAD.  I have encouraged her to f/u with PCP as well as OB-GYN to determine if there is another cause for her pain.   We tested her hepatic function as well as a CK.  Both results were WNL.  Her updated medication list for this problem includes:    Fenofibrate 160 Mg Tabs (Fenofibrate) .Marland Kitchen... 1 by mouth daily    Lipitor 10 Mg Tabs (Atorvastatin calcium) .Marland Kitchen... Take 1 tablet by mouth once a day  Orders: TLB-Hepatic/Liver Function Pnl (80076-HEPATIC) TLB-CK Total Only(Creatine Kinase/CPK) (82550-CK)  Patient Instructions: 1)  Hold Lipitor for now.  We will call you in 2-3 weeks to see if you pain has improved.

## 2010-05-15 NOTE — Progress Notes (Signed)
Summary: refill**please resend**  Phone Note Refill Request Message from:  Patient on June 19, 2009 9:11 AM  Refills Requested: Medication #1:  METOPROLOL TARTRATE 25 MG  TABS Take one tablet twice daily   [facility]ed[facility] [address] **Please resend pharmacy did not receive**   Method Requested: Fax to Local Pharmacy Initial call taken by: Migdalia Dk,  June 19, 2009 9:12 AM    Prescriptions: METOPROLOL TARTRATE 25 MG  TABS (METOPROLOL TARTRATE) Take one tablet twice daily  #60 x 9   Entered by:   Marrion Coy, CNA   Authorized by:   Rollene Rotunda, MD, [facility]   Signed by:   Marrion Coy, CNA on 06/19/2009   Method used:   [facility]  [address] [id number]       [address]       [place], [place]  [address]       Ph: [phone] or [phone]       Fax: [phone]   RxID:   [id number]

## 2010-06-18 ENCOUNTER — Other Ambulatory Visit: Payer: Self-pay

## 2010-06-18 ENCOUNTER — Encounter (INDEPENDENT_AMBULATORY_CARE_PROVIDER_SITE_OTHER): Payer: Self-pay | Admitting: *Deleted

## 2010-06-18 ENCOUNTER — Other Ambulatory Visit: Payer: Self-pay | Admitting: Cardiology

## 2010-06-18 DIAGNOSIS — Z79899 Other long term (current) drug therapy: Secondary | ICD-10-CM

## 2010-06-18 LAB — LIPID PANEL
Cholesterol: 136 mg/dL (ref 0–200)
HDL: 47.8 mg/dL (ref >39.00)
LDL Cholesterol: 58 mg/dL (ref 0–99)
Total CHOL/HDL Ratio: 3
Triglycerides: 150 mg/dL — ABNORMAL HIGH (ref 0.0–149.0)
VLDL: 30 mg/dL (ref 0.0–40.0)

## 2010-06-18 LAB — HEPATIC FUNCTION PANEL
ALT: 16 U/L (ref 0–35)
AST: 22 U/L (ref 0–37)
Albumin: 4.1 g/dL (ref 3.5–5.2)
Alkaline Phosphatase: 39 U/L (ref 39–117)
Bilirubin, Direct: 0.1 mg/dL (ref 0.0–0.3)
Total Bilirubin: 0.9 mg/dL (ref 0.3–1.2)
Total Protein: 6.5 g/dL (ref 6.0–8.3)

## 2010-06-23 ENCOUNTER — Ambulatory Visit (INDEPENDENT_AMBULATORY_CARE_PROVIDER_SITE_OTHER): Payer: PRIVATE HEALTH INSURANCE

## 2010-06-23 ENCOUNTER — Encounter: Payer: Self-pay | Admitting: Cardiology

## 2010-06-23 DIAGNOSIS — E7800 Pure hypercholesterolemia, unspecified: Secondary | ICD-10-CM

## 2010-06-23 DIAGNOSIS — Z79899 Other long term (current) drug therapy: Secondary | ICD-10-CM

## 2010-06-26 ENCOUNTER — Encounter: Payer: Self-pay | Admitting: Cardiology

## 2010-06-26 ENCOUNTER — Ambulatory Visit (INDEPENDENT_AMBULATORY_CARE_PROVIDER_SITE_OTHER): Payer: PRIVATE HEALTH INSURANCE | Admitting: Cardiology

## 2010-06-26 DIAGNOSIS — I2510 Atherosclerotic heart disease of native coronary artery without angina pectoris: Secondary | ICD-10-CM

## 2010-07-01 NOTE — Letter (Signed)
Summary: Custom - Lipid  [facility], Main Office  [address]   [place], [place] [address]   Phone: [phone]  Fax: [phone]         June 23, 2010 MRN: [id number]   [name] [address] [place], [place]  [address]   Dear Ms. Duan,  We have reviewed your cholesterol results.  They are as follows:     Total Cholesterol:    136 (Desirable: less than 200)       HDL  Cholesterol:     47.80  (Desirable: greater than 40 for men and 50 for women)       LDL Cholesterol:       58  (Desirable: less than 100 for low risk and less than 70 for moderate to high risk)       Triglycerides:       150.0  (Desirable: less than 150)  Our recommendations include:    No changes - looks good!  Continue the same treatment.   Call our office at the number listed above if you have any questions.  Lowering your LDL cholesterol is important, but it is only one of a large number of "risk factors" that may indicate that you are at risk for heart disease, stroke or other complications of hardening of the arteries.  Other risk factors include:   A.  Cigarette Smoking* B.  High Blood Pressure* C.  Obesity* D.   Low HDL Cholesterol (see yours above)* E.   Diabetes Mellitus (higher risk if your is uncontrolled) F.  Family history of premature heart disease G.  Previous history of stroke or cardiovascular disease          *These are risk factors YOU HAVE CONTROL OVER.  For more information, visit .  There is now evidence that lowering the TOTAL CHOLESTEROL AND LDL CHOLESTEROL can reduce the risk of heart disease.  The American Heart Association recommends the following guidelines for the treatment of elevated cholesterol:  1.  If there is now current heart disease and less than two risk factors, TOTAL CHOLESTEROL should be less than 200 and LDL CHOLESTEROL should be less than 100. 2.  If there is current heart disease or two or more risk factors,  TOTAL CHOLESTEROL should be less than 200 and LDL CHOLESTEROL should be less than 70.  A diet low in cholesterol, saturated fat, and calories is the cornerstone of treatment for elevated cholesterol.  Cessation of smoking and exercise are also important in the management of elevated cholesterol and preventing vascular disease.  Studies have shown that 30 to 60 minutes of physical activity most days can help lower blood pressure, lower cholesterol, and keep your weight at a healthy level.  Drug therapy is used when cholesterol levels do not respond to therapeutic lifestyle changes (smoking cessation, diet, and exercise) and remains unacceptably high.  If medication is started, it is important to have you levels checked periodically to evaluate the need for further treatment options.    Thank you,     Avie Arenas, RN for [facility] Team

## 2010-07-01 NOTE — Assessment & Plan Note (Signed)
Summary: per walk in/saf   Visit Type:  Follow-up Primary Provider:  Dr. Debby Bud  CC:  CAD.  History of Present Illness: The patient presents for followup of known coronary disease. She has had a talk time in the last 12+ months with her mom being ill and other family issues. She hasn't been able to exercise as she had been. However, with her typical activities she does not have any chest pressure, neck or arm discomfort. She is not describing palpitations, presyncope or syncope. She is not mentioning PND or orthopnea. She has had no weight gain or edema.  Current Medications (verified): 1)  Metoprolol Tartrate 25 Mg  Tabs (Metoprolol Tartrate) .... Take One Tablet Twice Daily 2)  Fenofibrate 160 Mg Tabs (Fenofibrate) .Marland Kitchen.. 1 By Mouth Daily 3)  Aspirin 81 Mg  Tabs (Aspirin) .... Take One Tablet Once Daily 4)  Centrum   Tabs (Multiple Vitamins-Minerals) .... Take 1 Tablet Daily 5)  Zegerid 40-1100 Mg  Caps (Omeprazole-Sodium Bicarbonate) .... Take When Needed and As Directed 6)  Lipitor 10 Mg Tabs (Atorvastatin Calcium) .... Take 1 Tablet By Mouth Once A Day 7)  Ra Fish Oil 1000 Mg Caps (Omega-3 Fatty Acids) .... 8 Capsules Daily.  Allergies (verified): 1)  ! Codeine  Past History:  Past Medical History: Reviewed history from 12/13/2008 and no changes required. GASTROESOPHAGEAL REFLUX DISEASE (ICD-530.81) DYSLIPIDEMIA (ICD-272.4) CORONARY ARTERY DISEASE (ICD-414.00) FH Colon Ca    Past Surgical History: Reviewed history from 12/13/2008 and no changes required. TUBAL LIGATION, HX OF (ICD-V26.51) WISDOM TEETH EXTRACTION, HX OF (ICD-V15.9) ROTATOR CUFF REPAIR, RIGHT, HX OF (ICD-V45.89) * LAPAROSCOPIC SURGERY FOR ENDOMETRIOSIS CABG (status post CABG with a LIMA to the LAD     and LIMA to the right coronary artery in October 2007).  Review of Systems       As stated in the HPI and negative for all other systems.   Vital Signs:  Patient profile:   55 year old female Height:       64 inches Weight:      153 pounds BMI:     26.36 Pulse rate:   88 / minute Resp:     16 per minute BP sitting:   136 / 85  (right arm)  Vitals Entered By: Marrion Coy, CNA (June 26, 2010 12:22 PM)  Physical Exam  General:  Well developed, well nourished, in no acute distress. Head:  normocephalic and atraumatic Eyes:  PERRLA/EOM intact; conjunctiva and lids normal. Neck:  Neck supple, no JVD. No masses, thyromegaly or abnormal cervical nodes. Chest Wall:  well healed sternotomy scar Lungs:  Clear bilaterally to auscultation and percussion. Abdomen:  Bowel sounds positive; abdomen soft and non-tender without masses, organomegaly, or hernias noted. No hepatosplenomegaly. Msk:  Back normal, normal gait. Muscle strength and tone normal. Extremities:  No clubbing or cyanosis. Neurologic:  Alert and oriented x 3. Cervical Nodes:  no significant adenopathy Psych:  Normal affect.    Detailed Cardiovascular Exam  Neck    Carotids: Carotids full and equal bilaterally without bruits.      Neck Veins: Normal, no JVD.    Heart    Inspection: no deformities or lifts noted.      Palpation: normal PMI with no thrills palpable.      Auscultation: regular rate and rhythm, S1, S2 without murmurs, rubs, gallops, or clicks.    Vascular    Abdominal Aorta: no palpable masses, pulsations, or audible bruits.      Femoral Pulses:  normal femoral pulses bilaterally.      Pedal Pulses: normal pedal pulses bilaterally.      Radial Pulses: normal radial pulses bilaterally.      Peripheral Circulation: no clubbing, cyanosis, or edema noted with normal capillary refill.     EKG  Procedure date:  06/26/2010  Findings:      Sinus rhythm, rate 84, axis within normal limits, anterior T-wave inversions unchanged from, low voltage  Impression & Recommendations:  Problem # 1:  CORONARY ARTERY BYPASS GRAFT, HX OF (ICD-V45.81) She is having no new symptoms. It has been 5 years since her bypass  graft. I would like to put her on an exercise treadmill to evaluate her coronaries and most importantly gave her a prescription for exercise and she's not doing this.  Problem # 2:  DYSLIPIDEMIA (ICD-272.4)  She is being followed at the lipid clinic and has dual therapy. No change in this plan is indicated.  Her updated medication list for this problem includes:    Fenofibrate 160 Mg Tabs (Fenofibrate) .Marland Kitchen... 1 by mouth daily    Lipitor 10 Mg Tabs (Atorvastatin calcium) .Marland Kitchen... Take 1 tablet by mouth once a day  Other Orders: EKG w/ Interpretation (93000) Treadmill (Treadmill)  Patient Instructions: 1)  Your physician recommends that you schedule a follow-up appointment at the time of your treadmill 2)  Your physician recommends that you continue on your current medications as directed. Please refer to the Current Medication list given to you today. 3)  Your physician has requested that you have an exercise tolerance test.  For further information please visit [email or web address]  Please also follow instruction sheet, as given. Prescriptions: LIPITOR 10 MG TABS (ATORVASTATIN CALCIUM) Take 1 tablet by mouth once a day  #90 tablet x 3   Entered by:   Charolotte Capuchin, RN   Authorized by:   Rollene Rotunda, MD, [facility]   Signed by:   Charolotte Capuchin, RN on 06/26/2010   Method used:   Faxed to ...       [facility]       [address] [place], [place]  [address]       Ph: [phone]       Fax: [phone]   RxID:   [id number] FENOFIBRATE 160 MG TABS (FENOFIBRATE) 1 by mouth daily  #90 x 3   Entered by:   Charolotte Capuchin, RN   Authorized by:   Rollene Rotunda, MD, [facility]   Signed by:   Charolotte Capuchin, RN on 06/26/2010   Method used:   Faxed to ...       [facility]       [address] [place], [place]  [address]       Ph: [phone]       Fax: [phone]   RxID:   [id number] METOPROLOL TARTRATE 25 MG  TABS (METOPROLOL  TARTRATE) Take one tablet twice daily  #180 x 3   Entered by:   Charolotte Capuchin, RN   Authorized by:   Rollene Rotunda, MD, [facility]   Signed by:   Charolotte Capuchin, RN on 06/26/2010   Method used:   Faxed to ...       [facility]       [address] [place], [place]  [address]       Ph: [phone]       Fax: [phone]  RxID:   [id number] METOPROLOL TARTRATE 25 MG  TABS (METOPROLOL TARTRATE) Take one tablet twice daily  #60 Tablet x 0   Entered by:   Charolotte Capuchin, RN   Authorized by:   Rollene Rotunda, MD, [facility]   Signed by:   Charolotte Capuchin, RN on 06/26/2010   Method used:   [facility]        [facility]  [address] [id number]       [address]       [place], [place]  [address]       Ph: [phone] or [phone]       Fax: [phone]   RxID:   [id number]

## 2010-07-10 NOTE — Assessment & Plan Note (Signed)
Summary: rov/sp   Primary Provider:  Dr. Debby Bud   History of Present Illness: Mrs Busk presents today feeling just "ok". She has been dealing with a lot of family issues from her nephew to her mother's recent stroke. This high stress has caused her to fall away from her excercise and nutrition. She knows she needs to get back into her regular routine. She has been very compliant with her meds and remains very motivated to stay healthy.  Allergies: 1)  ! Codeine  Vital Signs:  Patient profile:   55 year old female Weight:      153.50 pounds BMI:     26.44 Pulse rate:   59 / minute BP sitting:   108 / 68  (right arm)   Impression & Recommendations:  Problem # 1:  DYSLIPIDEMIA (ICD-272.4) Latest lipid profile: Total Chol: 136 TG: 150 HDL: 47.8 LDL: 59  Counseled Mrs Golla on trying her best to get back to excercising. She admits she has fallen away from working out/healthy eating in several weeks to months. This is all 2/2 her mother's recent stroke and other family issues. I spoke with her about how excercising would also help when dealing with her stress levels.  She has had some pain that tends to fade throughout the day. Seems to be more of a arthritis type pain and not rhabdo 2/2 statin. However may consider changing statin therapy in future should it persist. Will see patient back in 6 months with visits set in September.    Her updated medication list for this problem includes:    Fenofibrate 160 Mg Tabs (Fenofibrate) .Marland Kitchen... 1 by mouth daily    Lipitor 10 Mg Tabs (Atorvastatin calcium) .Marland Kitchen... Take 1 tablet by mouth once a day Prescriptions: LIPITOR 10 MG TABS (ATORVASTATIN CALCIUM) Take 1 tablet by mouth once a day  #90 tablet x 3   Entered by:   Samantha Crimes PharmD   Authorized by:   Rollene Rotunda, MD, [facility]   Signed by:   Samantha Crimes PharmD on 06/23/2010   Method used:   [facility]  [address] [id number]       [address]       [place], [place]  [address]       Ph: [phone] or [phone]       Fax: [phone]   RxID:   [id number]

## 2010-08-06 ENCOUNTER — Encounter: Payer: PRIVATE HEALTH INSURANCE | Admitting: Cardiology

## 2010-08-11 ENCOUNTER — Other Ambulatory Visit: Payer: Self-pay | Admitting: *Deleted

## 2010-08-11 MED ORDER — FENOFIBRATE 160 MG PO TABS: 160.0000 mg | ORAL_TABLET | Freq: Every day | ORAL | Status: DC

## 2010-08-26 NOTE — Assessment & Plan Note (Signed)
[facility]                               LIPID CLINIC NOTE   NAME:[name], [name]                     MRN:          [id number]  DATE:12/08/2007                            DOB:          12-31-1955    [name] comes in today for followup of her hyperlipidemia therapy,  which includes Lipitor 10 mg daily, fenofibrate 54 mg daily, and fish  oil 1 g tablets, she takes a total of 8 capsules per day.  She has been  compliant with all 3 of these medications and tolerating them just fine.  She denies any new muscle aches or pains.  Her other medications include  metoprolol, multivitamin, aspirin 81 mg, Ambien, and Zegerid.   DIET:  She has been trying to maintain a heart-healthy diet, but admits  to having larger portion sizes.  She also reports not having nor  observing a healthy diet, as she has a nephew that has just moved into  her house and meal planning has been a little bit harder.   EXERCISE:  She is not exercising as much as she had in the past.  In the  past, she went to the gym 5 times a week and now because of the  situation in the household and some pain in her hip, she has only been  exercising 2 times a week.   PHYSICAL EXAMINATION:  Weight 163 pounds, blood pressure is 95/60, and  heart rate 60.   LABORATORY DATA:  Total cholesterol 127, triglycerides 246, HDL 26.8,  and LDL 42.5.  Liver function tests are within normal limits.   ASSESSMENT:  The patient's LDL remains at goal.  Triglycerides are  elevated above 150.  HDL is lower than ideal, the goal being greater  than 40.  In the past, the patient has had elevated fasting blood  glucoses, greater than 110.  She does not have significant history of  diabetes in her family.  She is not obese.  She sees Dr. Debby Bud as her  family practice doctor.   PLAN:  We are going to increase fenofibrate to the next dose, 160 mg  daily.  We discussed the slightly greater risk of myopathies and  hepatotoxicity with the combination of a statin and increasing the  fibrate dose.  She will call us with any problems or concerns in the  meantime.  We discussed with her increasing amount of exercise she is  getting per week and continuing to try to observe a heart-healthy diet  and reducing portion sizes.  I believe because of her elevated  triglycerides and elevated fasting glucoses in the past, she may have  some prediabetes.  We will leave followup of that issue with her regular  doctor.  Followup with [place] will be in 3 months, at which time we will  draw a lipid and liver panel.  Prescription was sent electronically to  her to  [facility] on [address].  This patient was seen along with [name], [facility] PharmD candidate.      Charolotte Eke, PharmD  Electronically Signed  Rollene Rotunda, MD, [facility]  Electronically Signed   TP/MedQ  DD: 12/08/2007  DT: 12/09/2007  Job #: [id number]   cc:   Rosalyn Gess. Norins, MD

## 2010-08-26 NOTE — Assessment & Plan Note (Signed)
[facility]                            CARDIOLOGY OFFICE NOTE   NAME:[name], [name]                     MRN:          [id number]  DATE:03/31/2007                            DOB:          15-Jul-1955    PRIMARY CARE PHYSICIAN:  Dr. Debby Bud.   REASON FOR PRESENTATION:  Evaluate patient with coronary disease and  recurrent chest pain.   HISTORY OF PRESENT ILLNESS:  The patient had herself added to the  schedule because she has had some recurrent chest discomfort.  She has  had a lot of stress as her father-in-law in [place] is critically ill  after breaking a hip.  They have been traveling back and forth.  She has  noticed chest discomfort. This has been substernal.  It is similar to  her previous GI complaints.  It is not similar to the angina that she  had previously which was more a bilateral shoulder ache.  It happens  every time she eats and does happen sometimes at night as well.  However, it is not with activity such as rushing around or doing routine  chores. She does not have any jaw discomfort.  There is no associated  diaphoresis or shortness of breath. There may be some nausea. She was  previously on Zegerid but has run out of this.  She thought this helped  in the past.  She wanted to make sure that this did not sound like an  anginal equivalent.  But again she does not think it is like her  previous pain that did go away after bypass.  This has never returned.   The patient has had no fevers, chills, or cough. She has had no PND or  orthopnea.   PAST MEDICAL HISTORY:  1. Coronary artery disease (LAD occluded, circumflex with small ramus      branch and a large posterolateral branch, diagonal branch filling      the circumflex with collaterals, the right coronary artery had 90%      proximal stenosis, an EF of 60% with hypokinesis of the apex.  The      patient had a CABG with a LIMA to the LAD and RIMA to the right      coronary  artery).  2. Dyslipidemia.  3. Gastroesophageal reflux disease.  4. Laparoscopic surgery for endometriosis.  5. Shoulder surgery.  6. Oral surgery (patient recently had dental implant and has had some      jaw pain and is taking pain medications).   ALLERGIES:  CODEINE.   MEDICATIONS:  Metoprolol 25 mg b.i.d., aspirin 81 mg daily, Centrum,  fish oil, Prilosec, doxycycline (patient stopped Lipitor and fenofibrate  after getting aches and pains on a higher dose of fenofibrate when we  increased this).   REVIEW OF SYSTEMS:  Positive for difficulty sleeping, and stress.   PHYSICAL EXAMINATION:  The patient is in no distress with blood pressure  108/74, heart rate 74, weight 173 pounds, body mass 28.  HEENT:  Eyes unremarkable, pupils are equal, round and reactive to  light, fundi not visualized, oral mucosa unremarkable.  NECK:  No jugular venous distension at 45 degrees, carotid upstroke  brisk and symmetric, no bruits, no thyromegaly.  LYMPHATICS:  No cervical, axillary, inguinal adenopathy.  LUNGS:  Clear to auscultation bilaterally.  BACK:  No costovertebral angle tenderness.  CHEST:  Well-healed sternotomy scar.  HEART:  PMI not displaced or sustained, S1 and S2 within normal limits,  no S3, no S4, no click, no rubs, no murmurs.  ABDOMEN:  Flat, positive bowel sounds, normal in frequency and pitch, no  bruits, no rebound, no guarding, no midline pulsatile mass, no  lymphadenopathy, no splenomegaly.  SKIN:  No rash, no nodules.  EXTREMITIES:  Two+ pulses, no edema, no cyanosis, no clubbing.  NEURO:  Oriented to person, place, time, cranial nerves II-XII grossly  intact, motor grossly intact.   EKG:  Sinus rhythm, rate 70, old anterior septal infarct, no change from  previous EKGs.   ASSESSMENT AND PLAN:  1. Chest discomfort.  This chest discomfort is atypical for her      angina. I do think it is GI. I happens reproducibly with meals. At      this point I do not think  further cardiovascular testing is      suggested but she should be back on Zegerid. I have written a      prescription for this.  2. Dyslipidemia. She did not tolerate the higher dose of fenofibrate      at 160 mg in combination with Lipitor. However, she did tolerate      Lipitor 10 with fenofibrate 54 and I will restart this combination.      She needs lipids and liver in 8 weeks.  She will let me know if the      has any aches and pains with this.  3. Gastroesophageal reflux disease as above.  4. Followup.  She is due to come back in August and will keep this      appointment though she will get the blood work in 2 weeks.     Rollene Rotunda, MD, [facility]  Electronically Signed    JH/MedQ  DD: 03/31/2007  DT: 03/31/2007  Job #: [id number]   cc:   Rosalyn Gess. Norins, MD

## 2010-08-26 NOTE — Assessment & Plan Note (Signed)
[facility]                         GASTROENTEROLOGY OFFICE NOTE   NAME:[name], [name]                     MRN:          [id number]  DATE:05/18/2007                            DOB:          [birth year]    PROBLEM:  Abdominal discomfort.   [name] has returned for re-evaluation.  She is complaining of  postprandial abdominal fullness and discomfort with severe eructations.  She has discomfort under the right shoulder blade that is relieved with  belching.  Symptoms continue despite Zegerid.  Discomfort also radiates  into the chest, but is nonexertional.  On recent cardiac evaluation, it  was not felt that her chest discomfort was due to cardiac ischemia.  [name] 53 year old brother was recently diagnosed with colon  and rectal cancer.  She has no lower GI complaints.   MEDICATIONS:  Include Lipitor, baby aspirin, Zegerid, metoprolol, and  Mylanta.   ALLERGIES:  She is allergic to CODEINE.   PHYSICAL EXAMINATION:  Pulse 64, blood pressure 120/70, weight 172.  HEENT: EOMI.  PERRLA.  Sclerae are anicteric.  Conjunctivae are pink.  NECK:  Supple without thyromegaly, adenopathy or carotid bruits.  CHEST:  Clear to auscultation and percussion without adventitious  sounds.  CARDIAC:  Regular rhythm; normal S1 S2.  There are no murmurs, gallops  or rubs.  ABDOMEN:  Bowel sounds are normoactive.  Abdomen is soft, nontender and  nondistended.  There are no abdominal masses, tenderness, splenic  enlargement or hepatomegaly.  EXTREMITIES:  Full range of motion.  No cyanosis, clubbing or edema.  RECTAL:  Deferred.   IMPRESSION:  1. Postprandial abdominal and chest discomfort.  The symptoms may be      due to non-ulcer dyspepsia or GERD, despite PPI therapy.  Chronic      cholecystitis is also another concern.  2. Family history of colorectal CA.   RECOMMENDATION:  1. Abdominal ultrasound.  2. Antacids p.r.n. while continuing  Zegerid.  3. To consider upper endoscopy, if ultrasound is not diagnostic.  4. Screening colonoscopy.     Barbette Hair. Arlyce Dice, MD,FACG  Electronically Signed   RDK/MedQ  DD: 05/18/2007  DT: 05/18/2007  Job #: [id number]   cc:   Rollene Rotunda, MD, [facility]

## 2010-08-26 NOTE — Assessment & Plan Note (Signed)
[facility]                               LIPID CLINIC NOTE   NAME:Ulrey, Naomie Demetrius Charity                     MRN:          [id number]  DATE:06/30/2007                            DOB:          [birth year]    FIRST OFFICE VISIT FOR LIPID CLINIC.   PAST MEDICAL HISTORY:  Hyperlipidemia, coronary artery disease status  post coronary artery bypass graft x2, preserved LV function,  gastroesophageal reflux disease.   MEDICATIONS:  1. Lipitor 10 mg daily.  2. Aspirin 81 mg daily.  3. Fenofibrate 54 mg daily.  4. Zegerid 40 mg daily.  5. Multivitamin daily.  6. Metoprolol 50 mg daily.  7. Mylanta as needed.  8. Fish oil 1 gram twice daily.   VITAL SIGNS:  Weight 167 pounds, blood pressure 110/70, heart rate 80.   LABORATORY DATA:  Total cholesterol 264, triglyceride 613, HDL 28, LDL  62.  LFTs within normal limits.   ASSESSMENT:  [name] is a very pleasant fit 55 year old female who  comes to the Lipid Clinic today for her first visit referred by her  cardiologist, Dr. Antoine Poche.  She had recently she has been on Lipitor 10  mg daily and fenofibrate 54 mg daily.  Last fall she had triglycerides  still greater than goal in the 200s and was increased to fenofibrate 160  at that time.  Shortly after that dose increase, she had increase in  myalgias, muscle aches, pains, although there were no documentation of  increased CK or LFTs at that time.  She was instructed to stop the  medications and return for followup.  At the time of her myalgias, her  CK was within normal limits at 96.  Upon followup visit, she was  instructed to restart for Lipitor at her previous dose of 10 mg a day  but to remain off of fenofibrate for the time being.  After restarting  her Lipitor for her LDL was at goal of less than 70 her total  cholesterol was greater than goal of less than 200, triglycerides  greater than goal of less than 150, HDL less than goal of greater than  40.  Upon discussion with Ms. Vandevoort, she says that over the last 6  months to year that she has decreased her compliance with her heart  healthy diet and her exercise regimen.  Earlier this year, her father-in-  law fell, broke his hip, was injured, and has subsequently passed.  It  has been a long-haul back to the [place] for her and her family, and  has put her normal routine on hold.  Since her return after his death  and her results of triglycerides of 600, she says that now she has a  regained sense of urgency to restart her exercise regimen, which she has  started on a daily basis walking on a treadmill and/or using a rowing  machine for an hour.  She does this 5 days a week.  She has also  decrease the sugars and fats in her diet, has increased the fiber.  She  states regular oatmeal  with fruit for breakfast, has started snacking on  low-fat crackers and Wheat Thins during the day.  She eats something  from [employer] for lunch and healthy evening meal, rarely snacks in the  after her evening meal.  She, in the last 2 weeks, has gotten a book  about eating with low cholesterol and also at the [facility] Diet,  which she has been reading and starting to implement in her daily  living.  We had lengthy discussion regarding the benefit of diet and  exercise.  She is very willing to continue this exercise regimen  she  has started.  She is also very willing to decrease the fats and  carbohydrates in her diet.  We have talked about alternatives to  snacking on crackers and to add fresh fruits and vegetables to her diet.     She also will decrease the amount of sub sandwiches with mayonnaise,  large amounts of dressing, less healthy meats, and substitute salads  with grilled chicken or more lean meats, [place], and ham on sandwiches.  She seems to be very adamant about following these lifestyle  modifications and I have encouraged her to do so.  We will follow up  with visits and assess  need for changes in medication in the future.   PLAN:  1. Is to continue Lipitor 10 mg daily.  2. Restart Tricor 54 mg daily.  3. Increase fish oil 2 grams 3 times a day and to decrease      triglycerides.  If this does not help to bring triglycerides to      goal and HDL remains low.  She does agree with the plan switching      to or trying Niaspan and the future.  4. Continue lifestyle modifications.  5. Follow-up visit in 8 weeks for lipid panel and LFTs.  Make      medication changes at that time.      Leota Sauers, PharmD  Electronically Signed      Jesse Sans. Daleen Squibb, MD, [facility]  Electronically Signed   LC/MedQ  DD: 06/30/2007  DT: 06/30/2007  Job #: [id number]

## 2010-08-26 NOTE — Assessment & Plan Note (Signed)
[facility]                            CARDIOLOGY OFFICE NOTE   NAME:[name], [name]                     MRN:          [id number]  DATE:11/23/2006                            DOB:          Jan 29, 1956    PRIMARY:  Dr. Illene Regulus.   REASON FOR PRESENTATION:  Evaluate patient with coronary disease and  dyslipidemia.   HISTORY OF PRESENT ILLNESS:  Patient is a very pleasant 55 year old  white female with history of coronary disease as described below.  She  has done very well since I last saw her.  She has been limited in her  activities because she has had some heavy menstrual bleeding and is  having uterine fibroids treated.  However, with her activities of daily  living she is not having any chest discomfort, neck or arm discomfort.  She is not having any palpitations, presyncope or syncope.  She is  having no PND or orthopnea.   PAST MEDICAL HISTORY:  1. Coronary artery disease (LAD occluded, circumflex with a small      ramus branch and a large posterolateral branch, diagonal branch      filling the circumflex collaterals, the right coronary artery at      90% proximal stenosis; the EF is 60% stenosis with a hypokinesis of      the apex; the patient had a CABG with a LIMA to the LAD and RIMA to      the right coronary artery).  2. Dyslipidemia.  3. Gastroesophageal reflux disease.  4. Laparoscopic surgery for endometriosis.  5. Shoulder surgery.  6. Oral surgery.   ALLERGIES:  CODEINE.   MEDICATIONS:  1. Metoprolol 25 mg b.i.d.  2. Fenofibrate 54 mg nightly.  3. Lipitor 10 mg nightly.  4. Aspirin 81 mg daily.  5. Centrum.  6. Cartia.   REVIEW OF SYSTEMS:  As stated in the HPI and otherwise negative for  other systems.   PHYSICAL EXAMINATION:  Patient is in no distress.  Blood pressure  122/82, heart rate 72 and regular, weight 169 pounds, body mass index  28.  HEENT:  Eyelids unremarkable, pupils equally round and reactive to  light, fundi not visualized, oral mucosa unremarkable.  NECK:  No jugular venous distention, wave form within normal limits,  carotid upstroke brisk and symmetrical, no bruits, no thyromegaly.  LYMPHATICS:  No cervical, axillary, inguinal adenopathy.  LUNGS:  Clear to auscultation bilaterally.  BACK:  No costovertebral angle tenderness.  CHEST:  Unremarkable.  HEART:  PMI not displaced or sustained, S1 and S2 within normal limits,  no S3, no S4, no clicks, no rubs, no murmurs.  ABDOMEN:  Flat, positive bowel sounds normal in frequency and pitch, no  bruits, no rebound, no guarding, no midline pulsatile mass, no  organomegaly.  SKIN:  No rashes, no nodules.  EXTREMITIES:  With 2+ pulses throughout, no cyanosis, no clubbing, no  edema.  NEURO:  Oriented to person, place, and time; cranial nerves II-XII  grossly intact, motor grossly intact.   EKG sinus rhythm, rate 72, axis within normal limits, intervals within  normal limits, no  acute ST-T wave changes.   ASSESSMENT/PLAN:  1. Coronary disease.  The patient is having no ongoing symptoms.  She      is participating in secondary risk reduction.  No further      cardiovascular testing is suggested.  2. Dyslipidemia.  The patient's triglycerides are elevated at 222.      Her HDL is still below target at 39.6, her LDL direct is only 39.1.      I am going to increase her fenofibrate to 160 mg daily.  She will      get a lipid and liver profile in 8 weeks.  3. Followup.  I will see the patient again in 1 year or sooner if      needed.     Rollene Rotunda, MD, [facility]  Electronically Signed    JH/MedQ  DD: 11/23/2006  DT: 11/24/2006  Job #: [id number]

## 2010-08-26 NOTE — Assessment & Plan Note (Signed)
[facility]                            CARDIOLOGY OFFICE NOTE   NAME:[name], [name]                     MRN:          [id number]  DATE:11/21/2007                            DOB:          10/06/1955    PRIMARY CARE PHYSICIAN:  Rosalyn Gess. Norins, MD   REASON FOR PRESENTATION:  Evaluate the patient with coronary disease.   HISTORY OF PRESENT ILLNESS:  The patient presents for followup of the  above.  Since I last saw her, she has done quite well.  She has not been  exercising as frequently as I would like.  However, with her walking she  does not get any chest discomfort, neck or arm discomfort.  She does not  get any palpitations, presyncope, or syncope.  She has no PND or  orthopnea.  She has little chest soreness when she lifts her 32-pound  grandchild.   She is getting close follow up in the Lipid Clinic, but still has low  HDLs.   PAST MEDICAL HISTORY:  1. Coronary artery disease (status post CABG with a LIMA to the LAD      and LIMA to the right coronary artery in October 2007).  2. Dyslipidemia.  3. Gastroesophageal reflux disease.  4. Laparoscopic surgery for endometriosis.  5. Shoulder surgery.  6. Oral surgery.   ALLERGIES:  CODEINE.   MEDICATIONS:  1. Lipitor 10 mg daily.  2. Metoprolol 25 mg b.i.d.  3. Fenofibrate 54 mg daily.  4. Centrum.  5. Aspirin 81 mg daily.  6. Zolpidem.  7. Fish oil.  8. Zegerid.   REVIEW OF SYSTEMS:  As stated in the HPI and otherwise negative for all  other systems.   PHYSICAL EXAMINATION:  GENERAL:  The patient is in no acute distress.  VITAL SIGNS:  Blood pressure 115/70, heart rate 52 and regular.  HEENT:  Eyes unremarkable.  Pupils equal, round, and reactive to light.  Fundi not visualized.  Oral mucosa unremarkable.  NECK:  No jugular venous distention to 45 degrees.  Carotid upstroke  brisk and symmetric.  No bruits.  No thyromegaly.  LYMPHATICS:  No cervical, axillary, or inguinal  adenopathy.  LUNGS:  Clear to auscultation bilaterally.  BACK:  No costovertebral angle mass.  CHEST:  Well-healed sternotomy scar.  HEART:  PMI not displaced or sustained.  S1 and S2 within normal limits.  No S3.  No S4.  No clicks. No rubs.  No murmurs.  ABDOMEN:  Flat.  Positive bowel sounds.  Normal in frequency and pitch.  No bruits.  No rebound.  No guarding.  No midline pulsatile mass.  No  hepatomegaly.  No splenomegaly.  SKIN:  No rashes.  No nodules.  EXTREMITIES:  2+ pulses throughout.  No edema, cyanosis, or clubbing.  NEURO:  Oriented to person, place, and time.  Cranial nerves II through  XII grossly intact.  Motor grossly intact.   EKG, sinus rhythm, rate 68.  Axis within normal limits.  Intervals  within normal limits.  T-wave inversions in the anterior leads not  changed from previous.   ASSESSMENT/PLAN:  1. Coronary  disease.  The patient has coronary disease status post      coronary artery bypass graft in 2007.  She is doing quite well and      has no symptoms.  For the most part, she is practicing risk      reduction though I would like her to exercise more frequently and      we discussed this.  I probably would not perform a routine stress      perfusion study for another couple of years, but sooner if she has      any symptoms.  2. Dyslipidemia.  Her HDL remains low and she is followed closely in      the Lipid Clinic.  I will review this with Dr. Jimmey Ralph.  3. Follow up.  I will see the patient back in 1 year or sooner if      needed.     [name] Rotunda, MD, [facility]  Electronically Signed    JH/MedQ  DD: 11/21/2007  DT: 11/22/2007  Job #: [id number]   cc:   Rosalyn Gess. Norins, MD

## 2010-08-26 NOTE — Assessment & Plan Note (Signed)
[facility]                         GASTROENTEROLOGY OFFICE NOTE   NAME:[name], [name]                     MRN:          [id number]  DATE:07/12/2007                            DOB:          04/03/1956    PROBLEM:  Abdominal pain.   HISTORY:  [name] has returned for a scheduled followup.  She  reports no further GI complaints.  She is taking Zegerid twice daily.  Specifically, she is without abdominal pain or eructations or chest  pain.   A workup including an upper endoscopy and abdominal ultrasound were  negative.  A screening colonoscopy was also remarkable only for  diverticulosis.  A recent transvaginal ultrasound demonstrated a  resolution of the focal hypoechoic area in the left ovary.   PHYSICAL EXAMINATION:  VITAL SIGNS:  Pulse 60, blood pressure 106/68,  weight 170 pounds.   IMPRESSION:  Abdominal pain - resolved.  Presumably this was due to some  variant of gastroesophageal reflux disease.   RECOMMENDATIONS:  Reduce Zegerid to 40 mg q.a.m.  After approximately  four more weeks, I instructed her to try weaning herself from that if  possible.  She will return as needed.     Barbette Hair. Arlyce Dice, MD,FACG  Electronically Signed    RDK/MedQ  DD: 07/12/2007  DT: 07/12/2007  Job #: [id number]   cc:   Rollene Rotunda, MD, [facility]  Leatha Gilding. Mezer, M.D.

## 2010-08-26 NOTE — Assessment & Plan Note (Signed)
[facility]                               LIPID CLINIC NOTE   NAME:[name], [name]                     MRN:          [id number]  DATE:08/25/2007                            DOB:          05/31/1955    Ms. Leis is seen back in the Lipid Clinic for further evaluation of  medication titration associated with her history of coronary artery  disease status post  Coronary artery bypass grafting x2, and  hyperlipidemia.  She has been feeling and doing well since her last  visit.  She has been motivated.  She has been working to increase her  physical activity and decrease her dietary fats intake.  She is seen  here at the practice by Dr. Antoine Poche.  She has been feeling and doing  well overall.  She has had no muscle aches, pains, weakness, fatigue or  any other issues.  She has been compliant with her medications.   PAST MEDICAL HISTORY:  Noted to include:  1. Hyperlipidemia.  2. Coronary artery bypass grafting x 2 with preserved left ventricular      function.  3. Gastroesophageal reflux disease.   CURRENT MEDICATIONS:  1. Zegerid 40 mg twice daily.  2. Lipitor 10 mg daily.  3. Metoprolol 25 mg twice daily.  4. Fenofibrate 54 mg daily.  5. Centrum Cardio twice daily (multivitamin).  6. Aspirin 81 mg daily.  7. Zolpidem 10 mg daily at bedtime.  8. Fish oil 8 grams daily.   REVIEW OF SYSTEMS:  INCLUDES DRUG ALLERGIES OF CODEINE.   VITAL SIGNS:  Blood pressure today is 112/68.  Heart rate is 64.  Respirations are 16 and comfortable.   LABORATORY DATA:  Aug 19, 2007:  Normal CMET excepting a glucose of 111,  which we have agreed to monitor and follow.  Normal LFTs.  Total  cholesterol 115.  Triglycerides 189.  HDL 29.2.  LDL 48.  I have given  the patient a copy of these labs.   ASSESSMENT:  The patient has been compliant with diet, has been  compliant with exercise therapy, and appears to be doing well overall.  The patient will continue to  work to improve her dietary intake and  exercise activity.  I have discussed with her that I can increase her  medications, but with her triglycerides being so close to goal, we  really have affect some dietary and exercise activities in order to get  triglycerides down and HDL up.  We have discussed the long-term safety  and efficacy data associated with use of Niacin and Fenofibrate, as well  as the statins.  The patient agrees with this plan.  We will follow up  in 3-4 months after she goes on her cruise for her 52th birthday.  She  will call with questions or problems in the meantime and if muscle aches  or pains develop.  As it is now, she will continue her Lipitor and  Tricor combination therapy, and again call with questions or problems in  the meantime.   Thank you for the opportunity to see this pleasant  patient.      Shelby Dubin, PharmD, BCPS, CPP  Electronically Signed      Rollene Rotunda, MD, [facility]  Electronically Signed   MP/MedQ  DD: 08/25/2007  DT: 08/25/2007  Job #: [id number]

## 2010-08-29 NOTE — Consult Note (Signed)
NAMEConsuella, [name] [name]              ACCOUNT NO.:  [id number]   MEDICAL RECORD NO.:  [id number]          PATIENT TYPE:  INP   LOCATION:  3305                         FACILITY:  [facility]   PHYSICIAN:  Sheliah Plane, MD    DATE OF BIRTH:  02-Jan-1956   DATE OF CONSULTATION:  01/15/2006  DATE OF DISCHARGE:                                   CONSULTATION   FOLLOWUP CARDIOLOGIST:  Dr. Antoine Poche.   PRIMARY CARE PHYSICIAN:  Dr. Reed Breech.   REASON FOR CONSULTATION:  Critical coronary anatomy with increasing angina.   HISTORY OF PRESENT ILLNESS:  The patient is a 55 year old female who  approximately 1 year ago had the onset of left shoulder pain which was  thought to be a rotator cuff, but at the same time was having episodes of  substernal discomfort radiating to the left arm.  In October 2006, she had  right shoulder surgery.  Prior to that, an EKG was abnormal; it showed T-  wave abnormalities.  Since that time, the patient has had episodic  substernal chest pain usually related with exertion.  However, over the past  several weeks, she has noticed increasing episodes of substernal discomfort  with activities and radiating into both arms usually relieved with rest,  occasionally with nausea.  She said no definite history of acute myocardial  infarction that she is aware of.  Because of these symptoms, she was  referred to Dr. Antoine Poche yesterday and arranged for cardiac catheterization  to be done in Outpatient Catheterization Lab today.   CARDIAC RISK FACTORS:  The patient denies hypertension, denies diabetes,  denies smoking.  Total cholesterol was 203.  The remainder of lipid panel is  unknown.  The patient's father died at 73.  The patient's mother has known  heart disease but this is primarily mitral valve prolapse.  She has had  catheterization at age 79 and had no coronary disease.  The patient's chart  notes myocardial infarction in her mother at age 56 though according to the  patient's history, that is not well documented.  She tells me now that the  catheterization showed no evidence of disease.   PAST SURGICAL HISTORY:  Oral surgery.  Laparoscopic surgery for  endometriosis.  Right shoulder surgery.   SOCIAL HISTORY:  The patient is married.  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  Has occasional alcohol use.   She is on Zegerid, Xanax.   DRUG ALLERGIES:  CODEINE HAS CAUSED HAND AND FEET BURNING SENSATION.  THE  PATIENT TOOK OXYCODONE WITH SHOULDER SURGERY WITHOUT DIFFICULTY.   REVIEW OF SYSTEMS:  Review of systems is positive for chest pain.  She  denies exertional shortness of breath, resting shortness of breath,  orthopnea, presyncope, syncope, or palpitations general review of systems  and denies any constitutional symptoms, denies respiratory symptoms.  Has a  history of GERD x10-12 months.  Does have frequent headaches.  Right  shoulder pain.  Had an endoscopy 2 years ago.  Psychiatric history is  positive for anxiety.   PHYSICAL EXAMINATION:  VITAL SIGNS:  On physical exam, blood pressure  123/67.  Pulse of 96.  Respiratory rate is 18.  O2 saturation is 97% on room  air.  5'4 tall. Weighs 164 pounds.   LABORATORY FINDINGS:  White count 5.5, hemoglobin 14, hematocrit 40.  Pro  time 12.3.  PTT 31.  Creatinine 0.8.  Glucose 94.  EKG shows anterolateral T-  wave changes.  Cardiac catheterization is performed by Dr. Juanda Chance which  shows a proximal 95% lesion of the right coronary artery with collateral  filling to the totally occluded LAD.  Overall ejection fraction is preserved  with apical hypokinesis.   IMPRESSION:  A patient with significant two-vessel coronary artery disease  and increasing symptoms of preinfarctional angina.  I agree with Dr. Juanda Chance;  admit the patient, stabilize her, and I agree with recommendation for  coronary artery bypass grafting.  I have discussed the risks and options  with the patient. With the totally occluded  left anterior descending, it is  unlikely that this could be opened.  If in an urgent situation, she could  have balloon angioplasty of the right coronary artery, but this would still  leave the left anterior descending territory which appears to involve viable  muscle.  The patient is agreeable with proceeding with bypass surgery.  I  would consider a left internal mammary to the left anterior descending and  possibly the right internal mammary to the right or endoscopic vein  harvested from the right leg for the right coronary graft.  She and her  husband have had their questions answered and are willing to proceed.  We  will tentatively plan for surgery on 01/18/2006.      Sheliah Plane, MD  Electronically Signed     EG/MEDQ  D:  01/15/2006  T:  01/15/2006  Job:  [id number]   cc:   Onalee Hua L. Reed Breech, M.D.  Rollene Rotunda, MD, [facility]

## 2010-08-29 NOTE — Discharge Summary (Signed)
NAME:  [name], Diguglielmo [name]              ACCOUNT NO.:  [id number]   MEDICAL RECORD NO.:  [id number]          PATIENT TYPE:  INP   LOCATION:  2023                         FACILITY:  [facility]   PHYSICIAN:  Rowe Clack, P.A.-C. DATE OF BIRTH:  12-Aug-1955   DATE OF ADMISSION:  01/15/2006  DATE OF DISCHARGE:  01/23/2006                                 DISCHARGE SUMMARY   HISTORY OF PRESENT ILLNESS:  The patient is a 55 year old female who,  approximately 1 year ago, had the onset of left shoulder pain which was  thought to be a rotator cuff but at the same time was having episodes of  substernal discomfort radiating to the left arm.  In October 2006, she had  right shoulder surgery.  Prior to that, an EKG was abnormal showing T-wave  abnormalities.  At that time the patient had been having episodic substernal  chest pain related to exertion.  However, over the past several weeks, she  has noticed increasing episodes of substernal discomfort with activity and  radiation into both arms, usually relieved with rest but occasionally  associated with nausea.  She had no definite history of myocardial  infarction.  She was seen by [facility], Dr. Oscar La, who did  feel that there was some component of gastroesophageal reflux, but the  symptoms were worrisome for cardiac disease, and she was subsequently  referred to Dr. Antoine Poche for cardiology evaluation.  The patient was seen by  Dr. Caprice Red and recommended proceeding with cardiac catheterization, and  she was admitted this hospitalization for that procedure and further  evaluation and treatment depending on findings.   PAST MEDICAL HISTORY:  Includes:  1. Mild hyperlipidemia.  2. Gastroesophageal reflux.   PAST SURGICAL HISTORY:  Includes right shoulder surgery and also  laparoscopic surgery for endometriosis.   MEDICATIONS:  Prior to admission included Zegerid and Xanax.   ALLERGIES:  INCLUDE CODEINE.   Family history, social  history, review of symptoms, and physical exam,  please see the history and physical done at the time of admission.   HOSPITAL COURSE:  The patient was admitted electively and taken to the  cardiac catheterization lab.  Findings performed by Dr. Juanda Chance revealed a  95% lesion of the right coronary artery with collateral filling and a  totally occluded left anterior descending coronary artery.  Overall, left  ventricular function was preserved with the exception of some apical  hypokinesis.  It was the cardiologist's opinion that surgical consultation  was deemed to be warranted, and this was done by Dr. Rickey Barbara,  who evaluated the patient and studies and recommended proceeding with  surgical revascularization.   PROCEDURE:  On 01/18/2006, the patient was taken to the operating room at  which time she underwent the following procedure.  Coronary artery bypass  grafting x2.  The following grafts were placed.  1.  Left internal mammary  artery to the LAD.  2.  Right internal mammary artery to the right coronary  artery.  The patient tolerated the procedure well and was taken to the  surgical Intensive Care Unit in stable condition.  POSTOPERATIVE HOSPITAL COURSE:  The patient is doing well.  She was  extubated without difficulty 5 hours postoperatively.  She initially did  require some inotropic support with dopamine and Neo-Synephrine for blood  pressure.  This was able to be weaned without significant difficulties.  All  routine lines, monitors, and drainage devices have been discontinued in a  standard fashion.  She does have a postoperative anemia, but this has  stabilized.  Most recent hemoglobin and hematocrit dated 01/21/2006 is 9.1  and 26.1 respectively.  Electrolytes, BUN and creatinine are all within  normal limits for the same date.  The patient had some mild routine  postoperative nausea which has resolved.  The patient's incision is healing  well without  evidence of infection.  She is maintaining normal sinus rhythm  with some sinus tachycardia and beta blocker has been adjusted, titrating  for heart rate.  The patient has required aggressive pulmonary toilet for  postoperative atelectasis and has improved.  Oxygen saturations are now  adequate with oxygen discontinued.  She has tolerated routine advancement  and activity commenced for level of postoperative convalescence using  routine protocols.  The patient's overall status is felt to be stable for  discharge tentatively on 01/23/2006 pending morning round reevaluation.   INSTRUCTIONS:  The patient will receive written instructions in regard to  medications, activity, diet, wound care and followup.  Followup with Dr.  Tyrone Sage on February 11, 2006.  She is additionally instructed to follow up  with Dr. Antoine Poche in 2 weeks with a chest x-ray.   MEDICATIONS:  Medications on discharge will be as follows.  Aspirin 325 mg  daily, Lopressor 25 mg b.i.d., Lipitor 20 mg daily, folic acid 1 mg daily,  multivitamin 1 daily, Zegerid 40 mg daily.  For pain, Tylox 1 or 2 every 4  to 6 hours as needed.   FINAL DIAGNOSES:  Include the following:  Severe 2-vessel coronary artery  disease as described, now status post surgical revascularization.  Other  diagnoses include hyperlipidemia, gastroesophageal reflux, postoperative  anemia.      Rowe Clack, P.A.-C.     Sherryll Burger  D:  01/22/2006  T:  01/23/2006  Job:  [id number]   cc:   Sheliah Plane, MD  Rollene Rotunda, MD, [facility]  Kinnie Scales. Reed Breech, M.D.  Deneen Harts, MD

## 2010-08-29 NOTE — Assessment & Plan Note (Signed)
[facility]                           PRIMARY CARE OFFICE NOTE   NAME:[name], [name]                     MRN:          [id number]  DATE:06/24/2006                            DOB:          1955-10-19    [name] is a delightful 55 year old woman who presents to  establish her ongoing continuity care.  The patient has no active  complaints at today's visit, but needs monitoring of her routine medical  problems.   PAST SURGICAL HISTORY:  1. BTL in 2003.  2. Rotator cuff repair right shoulder October 2006.  3. Coronary artery bypass grafting January 18, 2006.  4. Wisdom tooth extraction in June 2007.   GYN HISTORY:  The patient is a gravida 0, para 0.   PAST MEDICAL HISTORY:  1. Usual childhood diseases include chicken pox and mumps, and the      patient is fully immunized.  2. Coronary artery disease.  3. Hyperlipidemia.  4. Question of GERD which may have been angina.   CURRENT MEDICATIONS:  1. Fenofibrate 54 mg daily.  2. Lipitor 10 mg daily.  3. Bayer aspirin 81 mg daily.  4. Centrum Cardio 2 tablets daily.   FAMILY HISTORY:  Paternal grandfather with alcohol disease. Mother with  arthritis.  Paternal grandfather with colon cancer, maternal aunt with  breast cancer, hyperlipidemia in parents and grandparents. Maternal  grandmother with heart disease.  Paternal grandfather with CVA.  Mother  with hypertension and history of depression.  A second-degree relative  with sudden death at less than age 84.  Grandparents with a history of  diabetes.   SOCIAL HISTORY:  The patient has a BA degree.  She works as an Geophysicist/field seismologist  to the Art therapist at [employer].  She has been married for 6  years and reports that her marriage is in good health.  The patient  reports that she and her husband have just recently built a new home.  She reports that she is working, almost full time at this point.   REVIEW OF SYSTEMS:  The patient reports  that she is having some insomnia  which is a new problem; it is sleep duration in nature.  The patient has  had a 10-pound weight loss over the last 12 months.  She reports  increasing presbyopia and it has been more than 2 years since her last  eye exam.  The patient is having dental problems with a wisdom tooth  extracted in 2007.  CARDIOVASCULAR:  Per the HPI.  RESPIRATORY, GI, GU,  MUSCULOSKELETAL, AND DERMATOLOGIC:  All stable.   HABITS:  Tobacco none.  Alcohol a social beverage drinker.   PHYSICIAN ROSTER:  1. [name], M.D. for cardiology.  2. [name] Gilding. [name], M.D. for gynecology.  3. [name] Irani. Arlyce [name], M.D. for GI.  4. [name], M.D. for orthopedics.   CHART REVIEW:  Please see cardiology office notes which also detail the  patient's history.  As noted the patient had an RCA of 95% proximal  stenosis.  Surgery was LIMA to LAD and RIMA to RCA.  She had  a well-  preserved ejection fraction.  The last cardiology office visit was  05/21/2006.   The patient had an EGD on 0/21/2005 which was a normal study.  She has  not had colonoscopy.  Please see the hospital discharge dictations for  the patient's surgical history.  The patient's last chest x-ray was  01/14/2006 and showed no active disease.   PHYSICAL EXAMINATION:  VITAL SIGNS:  Temperature was 96.7; blood  pressure 103/76; pulse 84; weight 168.  GENERAL APPEARANCE:  This is a mildly overweight Caucasian woman in no  acute distress.  CHEST:  Clear with no rales, wheezes or rhonchi.  CARDIOVASCULAR:  2+ radial pulses, her precordium was quiet.  She has a  well-healed sternotomy scar.  She had a regular rate and rhythm without  murmurs.  No further exam conducted.   LABORATORY:  Hemoglobin 14.2 gm, white count 6300 with a normal  differential.  Chemistries were unremarkable with a serum glucose of  103.  Creatinine was 0.8.  Liver functions were normal.  Cholesterol  133, triglycerides 222, HDL was 39.6,  LDL was 39.1.  Thyroid function  was normal with a TSH of 1.23.  Urinalysis showed too many red blood  cells to count; and the patient was finishing her menstrual cycle.  No  further exam or labs reviewed.   ASSESSMENT AND PLAN:  1. Cardiovascular.  The patient is status post bypass surgery doing      very well with her recovery, currently stable.  She is followed on      a regular basis by Dr. Antoine [name].  2. Lipids.  The patient has had a great response to Fenofibrate and      Lipitor with an excellent LDL score at 39 and an HDL is at minimum,      but could be improved.  Will defer this to Dr. Antoine [name].  3. Health Maintenance.  The patient is current with her gynecologist.      She would be a candidate at age 85 for colorectal cancer screening.      She does seem to be stable in regards to orthopedic problems.   SUMMARY:  This is a delightful patient who is established for ongoing  primary care.  She is asked to return to see me on an as needed basis or  for routine followup in 6 months.     [name] Gess Norins, MD  Electronically Signed    MEN/MedQ  DD: 06/25/2006  DT: 06/26/2006  Job #: [id number]   cc:   [address] [place]  [place]  [address] Ms. Zakara  [name]

## 2010-08-29 NOTE — Assessment & Plan Note (Signed)
[facility]                            CARDIOLOGY OFFICE NOTE   NAME:[name]                       MRN:          [id number]  DATE:03/12/2006                            DOB:          1955/05/21    CARDIOLOGIST:  Rollene Rotunda, MD, [facility].   PRIMARY CARE PHYSICIAN:  Robert P. Merla Riches, MD.   HISTORY OF PRESENT ILLNESS:  Ms. [name] is a very pleasant 55-year-  old female patient who recently underwent two-vessel CABG. Her surgery  was performed on January 18, 2006. She has seen Dr. Antoine Poche back in  followup and has actually been doing well. She goes to cardiac rehab 3  times a week. Over the last few days, she has noticed some right-sided  chest discomfort. It seems to be with certain types of movement as well  as deep breaths. She denies any shortness of breath. She denies any  chest discomfort similar to her previous angina. She denies any syncope  or presyncope. She denies any lower extremity edema. Denies any fevers  or chills. She does have a dry cough that seems to be improving since  surgery. There is no hemoptysis.   CURRENT MEDICATIONS:  1. Lipitor 20 mg daily.  2. Metoprolol 25 mg b.i.d.  3. Multivitamin.  4. Aspirin 325 mg daily.  5. Motrin p.r.n.   ALLERGIES:  No known drug allergies.   PHYSICAL EXAMINATION:  GENERAL:  She is a well-nourished, well-developed  female.  VITAL SIGNS:  Blood pressure is 104/62, pulse 76, weight 163 pounds.  HEENT:  Unremarkable.  NECK:  Without JVD.  LYMPH:  Without lymphadenopathy.  CARDIAC:  Normal S1, S2. Regular rate and rhythm.  LUNGS:  Clear to auscultation.  ABDOMEN:  Soft, nontender.  EXTREMITIES:  Without edema.  CHEST:  Reveals well-healing median sternotomy incision as well as chest  tube incisions. Palpation of her right chest and sternum reveals no  abnormalities or deformities. No axillary lymphadenopathy noted.  BREASTS:  Examined briefly. No superior masses noted, no  nipple  discharge.   Electrocardiogram  reveals sinus rhythm with a heart rate of 76, no  significant changes since previous tracing.   IMPRESSION:  1. Chest wall pain.  2. Coronary artery disease.      a.     Status post coronary artery bypass graft with left internal       mammary artery to the left anterior descending and right internal       mammary artery to the right coronary artery October 2007.  3. Preserved left ventricular function.  4. Treated dyslipidemia.  5. Gastroesophageal reflux disease.  6. History of endometriosis.   PLAN:  The patient presents with chest wall pain. She does note that she  is moving and she has not been lifting any boxes but has been packing  boxes over the last several days. She does use the stepper machine at  cardiac rehab but has been doing this for the last 4 weeks. There have  been no injuries. She did have a recent viral gastroenteritis with  mainly symptoms of diarrhea. There was no  vomiting. She has no  deformities noted on her chest examination today. I reassured here. I  think she should proceed with 5-7 days of heat therapy as well as NSAID  therapy. I recommended Motrin 400 mg three times a day for at least 5-7  days. She should also cut down on her activity with packing boxes for  the next several days. If her symptoms are not improving or if they are  worsening, she is to call us and we will see her back sooner. Otherwise,  she can followup with Dr. Antoine Poche as scheduled.      Tereso Newcomer, PA-C  Electronically Signed      Rollene Rotunda, MD, [facility]  Electronically Signed   SW/MedQ  DD: 03/12/2006  DT: 03/13/2006  Job #: [id number]   cc:   Harrel Lemon. Merla Riches, M.D.

## 2010-08-29 NOTE — Op Note (Signed)
NAME:  [name], [name]              ACCOUNT NO.:  [id number]   MEDICAL RECORD NO.:  [id number]          PATIENT TYPE:  INP   LOCATION:  2023                         FACILITY:  [facility]   PHYSICIAN:  Sheliah Plane, MD    DATE OF BIRTH:  1955-08-28   DATE OF PROCEDURE:  01/18/2006  DATE OF DISCHARGE:                                 OPERATIVE REPORT   PREOPERATIVE DIAGNOSIS:  New onset of unstable angina in critical coronary  anatomy.   POSTOPERATIVE DIAGNOSIS:  New onset of unstable angina in critical coronary  anatomy.   SURGICAL PROCEDURE:  Coronary artery bypass grafting x2 with the left  internal mammary artery to the left anterior descending coronary artery, and  the right internal mammary is a pedicle graft to the mid right coronary  artery.   SURGEON:  Sheliah Plane, MD   FIRST ASSISTANT:  Salvatore Decent. Cornelius Moras, M.D.   BRIEF HISTORY:  Patient is a 55 year old female who has had vague symptoms  of chest discomfort and shoulder pain for at least several months, but in  the last several weeks, these symptoms have been rapidly escalating.  She  was seen in the office by Dr. Antoine Poche and scheduled for urgent cardiac  catheterization.  At the time of catheterization by Dr. Juanda Chance, the patient  was found to have totally occluded left anterior descending artery and high  grade proximal right coronary artery obstruction with jeopardized  collaterals to the LAD.  The circumflex was a moderate-sized vessel without  significant disease.  Coronary artery bypass grafting was recommended to the  patient because of EKG evidence of anterior ischemia and critical anatomy  with jeopardized collaterals, and it was not felt that angioplasty alone to  the right coronary artery would be sufficient.  Patient agreed and signed  informed consent.   DESCRIPTION OF PROCEDURE:  With Swan-Ganz and arterial line monitors in  place, patient underwent general endotracheal anesthesia without incidence.  The  skin of the chest and legs was prepped with Betadine and draped in the  usual sterile manner.  A median sternotomy was performed.  Left internal  mammary artery was dissected down as a pedicle graft.  The distal artery was  divided, had good free flow.  In a similar fashion, the right internal  mammary artery was dissected down as a pedicle graft and also had good free  flow.  Patient was systemically heparinized.  The pericardium was opened.  The ascending aorta and the right atrium were cannulated.  An aortic root  bent cardioplegic needle was introduced into the ascending aorta.  Patient  was placed on cardiopulmonary bypass 2.4 L/min/m2.  Sites of anastomosis  were selected.  Patient's body temperature cooled to 32 degrees.  Aortic  cross clamp was applied, and 500 cc of cold blood potassium cardioplegia was  administered with diastolic arrest of the heart.  Attention was turned first  to the right coronary artery at approximately the __________ margin of the  heart.  The right coronary artery was identified and was a moderate size  vessel.  Was opened and was approximately 1.8 mm  in size, proximally and  distally.  Using a running 8.0 Prolene, the right internal mammary artery as  a pedicle graft was anastomosed to the right coronary artery.  The right  internal mammary artery was anastomosed to the right coronary artery.  The  fascia was tacked to the epicardium.  Attention was then turned to the left  anterior descending artery, which was opened between the mid and distal  third using running.  The vessel was opened and measured 1.5 mm probe  distally.  Using a running 8.0 Prolene, the left internal mammary artery was  anastomosed to the left anterior descending coronary artery.  With release  of the bulldog on the right mammary artery, there was prompt blood flow into  the inferior surface of the heart, and the inferior surface of the heart  began beating, based on mammary flow  alone.  The bulldog on the left  internal mammary artery was removed, and there was prompt rise in myocardial  septal temperature.  Aortic cross clamp was removed with total cross clamp  time of 42 minutes.  Sites of anastomosis were inspected and free of  bleeding.  Patient was then ventilated and weaned from cardiopulmonary  bypass without difficulty.  She remained hemodynamically stable,  decannulated in the usual fashion.  Protamine sulfate was administered with  the operative field hemostatic.  Two atrial and two ventricular pacing wires  were applied.  The pericardium was reapproximated over the ascending aorta.  Slits in the pericardium, both on the right and the left allowed the mammary  arteries to drop down posteriorly so as to be out of the way, should a redo  need to be done in the future.  Bilateral pleural tubes and a single Blake  midline drain were left in place.  The sternum was closed with a #6  stainless steel wire, the fascia closed with interrupted 0 Vicryl running  and 3-0 Vicryl in the subcutaneous tissue, a 4-0 subcuticular stitch in the  skin edges.  Dry dressings were applied.  Sponge and needle count was  reported as correct at the completion of the procedure.  The patient  tolerated the procedure without obvious complication and was transferred to  the surgical intensive care unit for further postoperative care.      Sheliah Plane, MD  Electronically Signed     EG/MEDQ  D:  01/21/2006  T:  01/21/2006  Job:  [id number]   cc:   Everardo Beals. Juanda Chance, MD, [facility]  Rollene Rotunda, MD, [facility]

## 2010-08-29 NOTE — Assessment & Plan Note (Signed)
[facility]                            CARDIOLOGY OFFICE NOTE   NAME:[name], [name]                       MRN:          [id number]  DATE:05/21/2006                            DOB:          [birth year]    PRIMARY CARE PHYSICIAN:  Dr. Merla Riches   REASON FOR PRESENTATION:  Evaluate patient with coronary disease.   HISTORY OF PRESENT ILLNESS:  The patient is a pleasant, 55 year old  white female, who is status post CABG.  She has done well since I last  saw her.  She is doing cardiac rehab.  She has had a little discomfort  at the incision site in the lower sternum and under her right breast,  where she had a chest tube.  She is not having any of the chest  discomfort that she had prior to the procedure.  She is not getting any  neck or arm discomfort.  She has not been having any palpitations,  presyncope or syncope, but she has been denying any PND or orthopnea.  She had lots of questions today and we went through these, one by one.   PAST MEDICAL HISTORY:  Coronary artery disease (LAD occluded, circumflex  of small ramus branch and large posterolateral branch, diagonal branch,  filling via the circumflex collaterals, the right coronary artery had  95% proximal stenosis, the EF was 60% with hypokinesis at the apex; CABG  with the LIMA to the LAD and RIMA to the right coronary artery),  dyslipidemia, gastroesophageal reflux disease, laparoscopic surgery for  endometriosis, shoulder surgery, oral surgery.   ALLERGIES:  CODEINE.   MEDICATIONS:  1. Aspirin 81 mg daily.  2. Multivitamin.  3. Metoprolol 25 mg b.i.d.  4. Lipitor 20 mg daily.   REVIEW OF SYSTEMS:  As stated in the HPI, and otherwise negative for  other systems.   PHYSICAL EXAMINATION:  The patient is in no distress.  Blood pressure  117/80, heart rate 66 and regular, weight 163 pounds, body mass index  27.  HEENT:  Eyelids unremarkable.  Pupils equal, round and reactive to  light.   Fundi not visualized.  Oral mucosa unremarkable.  NECK:  No jugular venous distention.  Wave form within normal limits.  Carotid upstroke brisk and symmetric.  No bruits, no thyromegaly.  LYMPHATICS:  No cervical, axillary or inguinal adenopathy.  LUNGS:  Clear to auscultation bilaterally.  BACK:  No costovertebral angle tenderness.  CHEST:  Unremarkable, except for well-healed sternotomy scar.  HEART:  PMI not displaced or sustained.  S1 and S2 within normal limits.  No S3, no S4, no clicks, no rubs, no murmurs.  ABDOMEN:  Flat, positive bowel sounds, normal in frequency and pitch.  No bruits, rebound, guarding, midline pulsatile mass, hepatomegaly,  splenomegaly.  SKIN:  No rashes, no nodules.  EXTREMITIES:  2+ pulses throughout, no edema, no cyanosis, no clubbing.  NEUROLOGIC:  Oriented to person, place and time.  Cranial nerves II  through XII grossly intact.  Motor grossly intact.   EKG:  Sinus rhythm, rate 66, rightward axis, intervals within normal  limits, poor anterior R-wave progression,  T-wave inversion in the  anterior leads, less prominent than previous.   ASSESSMENT AND PLAN:  1. Coronary disease:  The patient is status post bypass and doing      well.  She is going to continue the cardiac rehab.  No further      cardiovascular testing is suggested for this.  2. Dyslipidemia:  The patient's LDL is very low at 39.3.  Her      triglycerides are much improved, but still at 233.  Her HDL is a      little bit better at 35.1.  At this point, I am going to decrease      her Lipitor to 10 mg daily, but add Tricor 54 mg daily.  We      discussed the liver toxicity and muscle toxicity that can occur      with this.  She will let me know if she has any problems, such as      flu-like muscle aches.  The goal will be an LDL less than 70, HDL      greater than 50, and triglycerides less than 150.  I will be      following this up with a lipid profile and liver enzymes in six       weeks.  3. Followup:  She is going to come back in six months, or sooner, if      needed.  She is due to see Dr. Debby Bud very soon as her new primary      care doctor.     Rollene Rotunda, MD, [facility]  Electronically Signed    JH/MedQ  DD: 05/21/2006  DT: 05/21/2006  Job #: [id number]

## 2010-08-29 NOTE — Assessment & Plan Note (Signed)
[facility]                              CARDIOLOGY OFFICE NOTE   NAME:[name], [name]                       MRN:          [id number]  DATE:02/08/2006                            DOB:          05/26/1955    PRIMARY CARE PHYSICIAN:  Dr. Merla Riches   REASON FOR ADMISSION:  Evaluate patient status post CABG.   HISTORY OF PRESENT ILLNESS:  Patient is a very pleasant 55 year old white  female with recent diagnosis of coronary disease.  She was found after an  abnormal EKG and presentation of chest discomfort to have an occluded LAD.  Circumflex gave rise to a small ramus branch and a large posterolateral  branch, there is a diagonal branch which filled via circumflex collaterals,  the right coronary artery had a 95% stenosis of the proximal right artery.  The EF was 60% with hypokinesis of the apex.   Patient subsequently underwent CABG with a LIMA to the LAD and a RIMA to the  right coronary artery.  Patient did well and now returns for followup.   She has done well and has had a little bit of sternal incisional discomfort.  She has not had any recurrent chest discomfort similar to previous angina.  She has not yet started cardiac rehab.  She denies any resting chest  pressure, neck discomfort, or arm discomfort.  She has not had any  palpitation, syncope or presyncope.  She denies any PND or orthopnea.  She  has had no fevers, cough or chills.   PAST MEDICAL HISTORY:  Dyslipidemia with predominantly hypertriglyceridemia  and a low HDL, gastroesophageal reflux disease, laparoscopic surgery for  endometriosis, shoulder surgery, oral surgery.   ALLERGIES:  CODEINE.   MEDICATIONS:  1. Zegerid 40 mg daily.  2. Lipitor 20 mg daily.  3. Folic Acid.  4. Metoprolol 25 mg b.i.d.  5. Multivitamin.  6. Aspirin 325 mg daily.  7. Alprazolam.   REVIEW OF SYSTEMS:  As stated in the history of present illness and  otherwise negative for other systems.   PHYSICAL EXAMINATION:  The patient is in no distress.  Blood pressure 112/78, heart rate 89 and regular.  Weight 162 pounds.  Body  mass index 27.  HEENT:  Eyes unremarkable.  Pupils equal, round, reactive to light, fundi  not visualized. Oral mucosa unremarkable.  NECK:  No jugular venous distention.  Wave form within normal limits.  Carotid upstroke brisk and symmetric,  no bruits, no thyromegaly.  LYMPHATICS:  No cervical, axillary, inguinal adenopathy.  LUNGS:  Clear to auscultation bilaterally.  BACK:  No costovertebral angle tenderness.  CHEST:  Well healed sternotomy scar.  HEART:  PMI not displaced or sustained.  S1 and S2 within normal limits, no  S3, no S4, no murmurs.  ABDOMEN:  Flat, positive bowel sounds, normal in frequency and pitch, no  bruits, no rebound, no guarding, no midline pulsatile mass, no hepatomegaly,  no splenomegaly.  SKIN:  No rashes, no nodules.  EXTREMITIES:  2+ pulses throughout.  No edema, no cyanosis, no clubbing.  NEUROLOGIC:  Oriented to person, place, time.  Cranial nerves II through XII  grossly intact, motor grossly intact.   EKG:  Sinus rhythm, rate 89, right axis deviation, anterolateral T wave  inversions consistent with ischemia.  No change from previous.   ASSESSMENT AND PLAN:  1. Coronary disease.  The patient has no recurrent symptoms.  She is doing      well status post CABG.  She is going to get a chest x-ray and followup      with Dr. Tyrone Sage later this week.  She is going to start cardiac      rehabilitation in a few days.  2. Dyslipidemia.  She predominantly had hypertriglyceridemia with a low      HDL.  Her direct LDL was only 68.  We are going to repeat this in about      2 weeks.  I suspect she is going to need combination therapy.  3. Followup.  I will see her back in about 4 months or sooner if needed.    ______________________________  Rollene Rotunda, MD, [facility]    JH/MedQ  DD: 02/08/2006  DT: 02/09/2006  Job #:  [id number]   cc:   Harrel Lemon. Merla Riches, M.D.

## 2010-08-29 NOTE — Cardiovascular Report (Signed)
NAME:  [name], [name]              ACCOUNT NO.:  [id number]   MEDICAL RECORD NO.:  [id number]          PATIENT TYPE:  OIB   LOCATION:  1963                         FACILITY:  [facility]   PHYSICIAN:  Bruce R. Juanda Chance, MD, FACCDATE OF BIRTH:  1955-08-19   DATE OF PROCEDURE:  01/15/2006  DATE OF DISCHARGE:  01/15/2006                              CARDIAC CATHETERIZATION   CLINICAL HISTORY:  [name] is 55 years old and has no prior history  of known heart disease.  She had an episode of chest pain about a year ago  which was attributed to reflux and she was treated with Protonix with some  improvement.  She had an electrocardiogram which showed T-wave abnormalities  in the anterior and lateral leads.  Over the last four weeks, she has had  some increase in the severity of her chest discomfort which comes on with  minimal exertion.  She was seen in consultation by Dr. Antoine Poche and he  arranged for catheterization today.  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  She has a mother who had a heart attack at age 79.  She  has some mild elevation of her lipids.   PROCEDURE:  The procedure was performed by the right femoral artery using  arterial sheath and 4-French preformed coronary catheters.  A front wall  arterial puncture was performed and Omnipaque contrast was used.  The  subclavian injection was performed to assess the intramammary artery for its  suitability for bypass grafting.   She tolerated the procedure well and left the laboratory in satisfactory  condition.   RESULTS:  The left main coronary artery:  The left main coronary was artery  free of significant disease.   Left anterior descending artery:  Left anterior descending artery was  completely occluded right at its very origin.   The circumflex artery:  Circumflex artery gave rise to a small ramus branch,  small marginal branch and large posterolateral branch.  These vessels were  free of significant  disease.  There was a diagonal branch which filled via  collaterals from the circumflex artery.   The right coronary artery:  The right coronary artery was a dominant vessel  that gave rise to two conus branches, a right ventricular branch, a  posterior branch and two posterolateral branches.  There was 95% stenosis of  the proximal right coronary after the conus branches.  The conus branches  filled the left anterior descending artery which appeared to be a reasonable  caliber vessel free of major obstruction.   LEFT VENTRICULOGRAM:  The left ventriculogram performed in the RAO  projection showed hypokinesis of the apex.  The overall wall motion was good  with an estimated fraction of 60%.   A subclavian injection:  A subclavian injection was performed which showed  patent internal mammary artery.   HEMODYNAMIC DATA:  The aortic pressure was 125/72 with a mean of 95 and left  ventricular pressure was 122/10.   CONCLUSION:  Coronary artery disease with total occlusion of the ostium of  the left anterior descending artery (chronic), no  significant obstruction in  the circumflex artery, and 95% stenosis in the proximal right coronary  artery with a small area of apical hypokinesis and an estimated ejection  fraction of 60%.   RECOMMENDATIONS:  The LAD is not favorable for revascularization and appears  to be a moderately large vessel supplying viable myocardium.  I think the  best option would be surgical revascularization.  Although her symptoms have  been relatively stable, they have increased over the last four weeks and her  anatomy appears critical and I think the best course would be to admit her  to the hospital today and arrange for surgical consultation.           ______________________________  Everardo Beals Juanda Chance, MD, [facility]     BRB/MEDQ  D:  01/15/2006  T:  01/16/2006  Job:  [id number]   cc:   Barbette Hair. Arlyce Dice, MD,FACG  Kinnie Scales Reed Breech, M.D.  Cardiopulmonary Lab

## 2010-08-29 NOTE — H&P (Signed)
NAME:  [name], [name]              ACCOUNT NO.:  [id number]   MEDICAL RECORD NO.:  [id number]          PATIENT TYPE:  OIB   LOCATION:  1963                         FACILITY:  [facility]   PHYSICIAN:  Bruce R. Juanda Chance, MD, FACCDATE OF BIRTH:  Sep 08, 1955   DATE OF ADMISSION:  01/15/2006  DATE OF DISCHARGE:                                HISTORY & PHYSICAL   CLINICAL HISTORY:  [name] is 55 years old and has had no prior  history of known heart disease. A year ago she was noted to have an abnormal  ECG but did not have any further evaluation at that time.  She recently was  seen by Dr. Arlyce Dice for follow up evaluation on symptoms of reflux and he  obtained a history of increasing exertional angina, and her ECG showed  anterolateral T wave changes. She was referred to Dr. Antoine Poche. Dr. Antoine Poche  arranged for her to come into the hospital for evaluation and angiography  today.   PAST MEDICAL HISTORY:  1. Significant for mild hyperlipidemia.  2. She also has a history of gastroesophageal reflux disease.  3. There is no history of hypertension, diabetes.  4. Past history is also significant for shoulder surgery.  5. Past history of laparoscopic surgery for endometriosis.   CURRENT MEDICATIONS:  Zegerid and Xanax.   ALLERGIES:  CODEINE.   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 She has no  children. Does not smoke.   FAMILY HISTORY:  Positive in that mother had a myocardial infarction at age  64. Her mother is still alive.   REVIEW OF SYSTEMS:  Positive for headaches and recent GERD symptoms.   PHYSICAL EXAMINATION:  VITAL SIGNS:  Blood pressure today was 125/72, and  pulse 65 and regular.  NECK:  There was no venous distention. Carotid pulses were full without  bruits.  CHEST:  Clear without rales or rhonchi.  CARDIAC:  Rhythm was regular. The heart sounds were normal, there were  no  murmurs, rubs, gallops.  ABDOMEN:  Soft with normal bowel sounds. There is no hepatosplenomegaly.  EXTREMITIES:  No edema. Pedal pulses were equal.  MUSCULOSKELETAL:  Showed no deformities.  SKIN:  Warm and dry.  NEUROLOGIC EXAMINATION:  No focal neurological signs.   Electrocardiogram which was taken in our office showed anterolateral T wave  change.   ADDENDUM:  At catheterization today the patient was found to have a totally  occluded LAD which appeared to be chronic and was fed by collaterals, and a  95% stenosis in the right coronary artery with a small area of apical  hypokinesis. Based on the anatomy and the crescendo symptoms, we felt it was  best to admit the patient for observation and plans for surgical  revascularization. The anatomy is not felt for percutaneous intervention  because of the location and chronicity of the LAD occlusion.   ASSESSMENT:  1. Crescendo angina.  2. Coronary artery disease as described above with total occlusion of the      left  anterior descending artery, and a 95% stenosis of the right      coronary artery.  3. Good left ventricular function with an ejection fraction of 60% and      mild apical hypokinesis.  4. Mild hyperlipidemia.  5. GERD.   RECOMMENDATIONS:  We will plan to admit the patient for observation and  surgical consultation with plans for surgical revascularization early next  week.           ______________________________  Everardo Beals Juanda Chance, MD, [facility]     BRB/MEDQ  D:  01/15/2006  T:  01/16/2006  Job:  [id number]   cc:   Barbette Hair. Arlyce Dice, MD,FACG  Kinnie Scales Reed Breech, M.D.  Rollene Rotunda, MD, [facility]

## 2010-08-29 NOTE — Assessment & Plan Note (Signed)
[facility]                              CARDIOLOGY OFFICE NOTE   NAME:[name], [name]                       MRN:          [id number]  DATE:01/14/2006                            DOB:          Aug 10, 1955    REFERRING PHYSICIAN:  Barbette Hair. Arlyce Dice, MD,FACG   PRIMARY CARE PHYSICIAN:  Kinnie Scales. Reed Breech, M.D.   REASON FOR REFERRAL:  Evaluate patient with chest pain.   HISTORY OF PRESENT ILLNESS:  The patient is a lovely 55 year old white  female without prior cardiac history.  She did have an episode of chest pain  about a year ago.  She was treated initially with Protonix and had  improvement in her symptoms.  Last year, in October, prior to a shoulder  surgery, she did have an EKG which demonstrated T wave abnormalities  consistent with anterolateral ischemia.  However, she went ahead and had her  surgery and had no problems.  As stated, she had resolution of her chest  pain.  Three months ago she had a recurrence of chest discomfort.  This is a  fullness in her chest.  Starts out as discomfort in both her arms and  radiating to her chest.  It happens with exertion.  It was happening first  with walking about 50 yards.  However, now it has progressed to happening  any time she does activities with her arms such as unloading the dryer.  She  gets 7/10 discomfort.  She sits down and it goes away after several minutes.  She denies any shortness of breath, diaphoresis.  On one occasion she did  get nauseated.  She has not had any symptoms at rest.  She denies any  palpitations, presyncope or syncope.  She has had no PND or orthopnea.   PAST MEDICAL HISTORY:  1. Mild hyperlipidemia (she recalls total cholesterol of 205).  2. She has had no hypertension, diabetes or hyperlipidemia.  3. She has gastroesophageal reflux disease.   PAST SURGICAL HISTORY:  1. Laparoscopic surgery for endometriosis.  2. Shoulder surgery.  3. Oral surgery.   ALLERGIES:   CODEINE.   MEDICATIONS:  Zegerid, Xanax.   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  She is married.  She has no children.  She has never smoked  cigarettes.  She drinks rarely.   FAMILY HISTORY:  Positive for her mother having myocardial infarction at age  67.  This was documented with cardiac catheterization but was a silent  event.  She is still alive and well at age 74.   REVIEW OF SYSTEMS:  Positive for headaches, recent diarrhea.  Negative for  all other systems.   PHYSICAL EXAMINATION:  GENERAL:  The patient is in no distress.  VITALS:  Blood pressure 124/96, heart rate 81 and regular, weight 164  pounds.  Body mass index 27.  HEENT:  Eyes unremarkable, pupils equal, round and reactive to light, fundi  within normal limits, oral mucosa unremarkable.  NECK:  No jugular venous distention.  Wave form within normal limits,  carotid upstroke brisk  and symmetric, no bruits, thyromegaly.  LYMPHATICS:  No cervical, axillary or inguinal adenopathy.  LUNGS:  Clear to auscultation bilaterally.  BACK:  No costovertebral angle tenderness.  CHEST:  Unremarkable.  HEART:  The PMI not displaced or sustained, S1 and S2 are within normal  limits, no S3, S4, no murmurs.  ABDOMEN:  Flat, positive bowel sounds, normal in frequency and pitch, no  bruits, no rebound, no guarding, no midline pulses, no mass, no  hepatomegaly, no splenomegaly.  SKIN:  No rashes, no nodules.  EXTREMITIES:  Pulses 2+ throughout, no edema, no cyanosis, no clubbing.  NEUROLOGIC:  Oriented to person, place and time, cranial nerves II through  XII are grossly intact, motor grossly intact throughout.   EKG:  Sinus rhythm, rate unusual.  P wave axis and right axis deviation  consistent with limb lead reversal, low voltage in the precordial leads,  anterolateral T-wave inversions consistent with ischemia but not different  than October, 2006.   1. Chest discomfort.  The patient's  chest discomfort is very worrisome for      unstable angina.  It is an accelerated pattern happening more      frequently with exertion.  She has a strong family history and an      abnormal EKG.  Given all of this, the pretest probability of      obstructive coronary disease is high.  I am going to give her an      aspirin, prescription for sublingual nitroglycerin and a low dose beta      blocker.  I have extensively discussed the risks, benefits of cardiac      catheterization as this is the appropriate test.  Patient understands      the risk of stroke, heart attack death, myocardial infarction, contrast      allergy, renal insufficiency and vascular trauma.  She agrees to      proceed.  Will do this electively in the morning unless she has      recurrent symptoms at night.  She is to refrain from activity today.  2. Risk reduction.  Will check a fasting lipid profile and manage      accordingly.  3. Follow up will be based on the results of the above.            ______________________________  Rollene Rotunda, MD, [facility]     JH/MedQ  DD:  01/14/2006  DT:  01/14/2006  Job #:  [id number]   cc:   Onalee Hua L. Reed Breech, M.D.

## 2010-08-29 NOTE — Assessment & Plan Note (Signed)
[facility]                                   ON-CALL NOTE   NAME:[name], [name]                       MRN:          [id number]  DATE:01/14/2006                            DOB:          06-Feb-1956    TELEPHONE CONVERSATION.   PRIMARY CARDIOLOGIST:  Rollene Rotunda, MD, [facility]   I received a telephone call through the answering service on October 4.  Ms.  Joswick stated that she had seen Dr. Antoine Poche in the office on the  previous day and had been scheduled for an outpatient cardiac  catheterization; and was told that a prescription for nitroglycerin and  another medication could be called in for her to take prior to her  catheterization in the morning.  She states that she was also informed to  take an regular strength aspirin.  When she called her drug store to inquire  about picking up the prescription she told that no prescription had been  called in for her.  It is after office hours.  I have checked, I do not have  a dictation noted from the previous visit with Dr. Antoine Poche as to what  medication had been called in for patient.  [name] has denied any  episodes of chest discomfort, tightness or shortness of breath.  I  instructed her to continue taking the aspirin if she did have any recurrent  chest discomfort she needs to go ahead and seek medical assistance and  followup with her cardiac catheterization that has already been scheduled.  Ms. Height agreed to these instructions.            ______________________________  Dorian Pod, ACNP      MB/MedQ  DD:  01/16/2006  DT:  01/18/2006  Job #:  [id number]

## 2010-08-29 NOTE — Assessment & Plan Note (Signed)
[facility]                               LIPID CLINIC NOTE   NAME:[name], [name]                     MRN:          [id number]  DATE:05/24/2008                            DOB:          Aug 14, 1955    The patient was seen by Lipid Clinic for further evaluation and  medication titration associated with [name] hyperlipidemia in the setting  of multiple risk factors.  The patient has been taking fenofibrate,  Lipitor 10, and fish oil with some level of compliance.  She has been  tolerating and she stated she had muscle and hip pain that she  attributes to [name] Ambien and since discontinuing this in November she  has had no further problems.  She does not use tobacco and has alcohol  only periodically.  She does have stressors that keep per [name] from  eating regularly and [name] meals are usually late in the day.  She uses  broiled or grilled meats, baked potato, cereals, whole grains, and [name]  husband is following a low-carb diet.  She has had a hip pain and has  been using a [place] like elliptical machine since Christmas.   Physical exam, weight today is 167 and 3/4 pounds, blood pressure  124/72, and heart rate is 70.   Labs on May 11, 2008, reveal total cholesterol 135, HDL 42,  triglycerides 128, and LDL 67.   ASSESSMENT:  The patient has reduced [name] triglycerides, [name] HDL is  increased, and [name] LDL has increased with [name] fenofibrate and fish oil.  She is taking 2 capsules of fish oil 4 tablets a day and is tolerating  as well.  Muscle pain that she experienced associated with Ambien went  away.  She will continue [name] meds and [name] diet.  She will follow up in 6  months in August.  She will call us questions or problems in the  meantime.       Shelby Dubin, PharmD, BCPS, CPP  Electronically Signed      Rollene Rotunda, MD, [facility]: Lambert Rhonda W  Electronically Signed   MP/MedQ  DD: 06/04/2008  DT: 06/05/2008  Job #: [id number]   cc:   Rollene Rotunda, MD,  [facility]

## 2010-08-29 NOTE — Assessment & Plan Note (Signed)
[facility]                           GASTROENTEROLOGY OFFICE NOTE   NAME:[name], [name]                       MRN:          [id number]  DATE:01/12/2006                            DOB:          1955/10/19    PROBLEM:  Chest pain.   [name] has returned again complaining of chest pain.  She was seen  approximately a year ago for similar complaints.  On empiric therapy with  Protonix, her pain subsided.  Electrocardiogram was normal.  An  echocardiogram was requested but I do no see any results.  She was doing  well until approximately a month ago, when she developed recurrent chest  pain.  This is described as a heaviness in her chest and both arms.  It is  exertionally related but may occur at rest as well.  She denies pyrosis, per  se.  Chest pressure is intermittently relieved with antacids.  She denies  dysphagia or odynophagia.  She states that she has been under a great deal  of pressure lately related to anxiety associated with building of a new  house.  She is having difficulty sleeping.  She takes no gastric irritants,  including nonsteroidals.  She also complains of postprandial diarrhea over  the last 2-3 weeks and some upper abdominal discomfort postprandially.   PHYSICAL EXAMINATION:  VITAL SIGNS:  Pulse 74, blood pressure 124/74, weight  167.  HEENT: EOMI. PERRLA. Sclerae are anicteric.  Conjunctivae are pink.  NECK:  Supple without thyromegaly, adenopathy or carotid bruits.  CHEST:  Clear to auscultation and percussion without adventitious sounds.  CARDIAC:  Regular rhythm; normal S1 S2.  There are no murmurs, gallops or  rubs.  ABDOMEN:  Bowel sounds are normoactive.  Abdomen is soft, non-tender and non-  distended.  There are no abdominal masses, tenderness, splenic enlargement  or hepatomegaly.  EXTREMITIES:  Full range of motion.  No cyanosis, clubbing or edema.  RECTAL:  Deferred.   IMPRESSION:  1. Recurrent chest pain  that has an exertional component.  Symptoms      certainly could be due to gastroesophageal reflux disease, exacerbated      by anxiety.  I have some concern about cardiac ischemia in view of her      symptoms that are worsened with exertion.  2. Diarrhea.  Likely secondary to anxiety.   RECOMMENDATION:  1. Switch Protonix to Zegerid 40 mg a day.  2. Upper endoscopy.  3. Refer to cardiology for evaluation.  4. Xanax 0.25 mg q.8h. p.r.n.       Barbette Hair. Arlyce Dice, MD,FACG      RDK/MedQ  DD:  01/12/2006  DT:  01/12/2006  Job #:  [id number]   cc:   Salvadore Farber, MD  Leatha Gilding Mezer, M.D.

## 2010-10-09 ENCOUNTER — Other Ambulatory Visit: Payer: Self-pay | Admitting: *Deleted

## 2010-10-09 ENCOUNTER — Telehealth: Payer: Self-pay | Admitting: Cardiology

## 2010-10-09 MED ORDER — FENOFIBRATE 160 MG PO TABS: 160.0000 mg | ORAL_TABLET | Freq: Every day | ORAL | Status: DC

## 2010-10-09 MED ORDER — ATORVASTATIN CALCIUM 10 MG PO TABS: 10.0000 mg | ORAL_TABLET | Freq: Every day | ORAL | Status: DC

## 2010-10-09 MED ORDER — METOPROLOL TARTRATE 25 MG PO TABS: 25.0000 mg | ORAL_TABLET | Freq: Two times a day (BID) | ORAL | Status: DC

## 2010-10-09 NOTE — Telephone Encounter (Signed)
Fenofibrate, metoprolol, simvastatin refills needed @ [facility] [phone] ref # [id number] rx's new to them they do not have mg's

## 2010-11-11 ENCOUNTER — Telehealth: Payer: Self-pay | Admitting: Cardiology

## 2010-11-11 NOTE — Telephone Encounter (Signed)
Spoke with pt who reports increased fatigue lately and weight gain of 13 lbs in last month. Also noticed hormonal changes and saw gynecologist who pt states told her wt. Gain is menopause related. Pt presently taking metoprolol 25 mg by mouth twice daily and she is asking if this dose could be lowered to see if it would help with fatigue.  Pt reports blood pressure is 90-106/50-52 with heart rate of 50 - 70. No chest pain, shortness of breath or palpitations.  I told pt I would forward to Dr. Antoine Poche to review. Pt aware Dr. Antoine Poche is out of office this week and next week.

## 2010-11-11 NOTE — Telephone Encounter (Signed)
Pt called and wanted to know if she could decrease the dosage of Metoprolol?  She feels very tired, gained 13 lbs.  She thinks if she could decrease this it might help her energy level.  Aware Pam is off today.

## 2010-11-12 NOTE — Telephone Encounter (Signed)
Pt aware ok to decrease metoprolol to 1/2 tablet bid.  She will call back if no improvement or if HR or BP goes up too high.

## 2010-11-12 NOTE — Telephone Encounter (Signed)
She can cut metoprolol in 1/2 and take 1/2 tab bid. If no better, schedule follow up. Tereso Newcomer, PA-C

## 2010-11-18 ENCOUNTER — Ambulatory Visit (INDEPENDENT_AMBULATORY_CARE_PROVIDER_SITE_OTHER): Payer: BC Managed Care – PPO | Admitting: Internal Medicine

## 2010-11-18 ENCOUNTER — Other Ambulatory Visit (INDEPENDENT_AMBULATORY_CARE_PROVIDER_SITE_OTHER): Payer: BC Managed Care – PPO

## 2010-11-18 ENCOUNTER — Encounter: Payer: Self-pay | Admitting: Internal Medicine

## 2010-11-18 DIAGNOSIS — R5383 Other fatigue: Secondary | ICD-10-CM

## 2010-11-18 DIAGNOSIS — E785 Hyperlipidemia, unspecified: Secondary | ICD-10-CM

## 2010-11-18 DIAGNOSIS — K219 Gastro-esophageal reflux disease without esophagitis: Secondary | ICD-10-CM

## 2010-11-18 DIAGNOSIS — Z23 Encounter for immunization: Secondary | ICD-10-CM

## 2010-11-18 DIAGNOSIS — I2510 Atherosclerotic heart disease of native coronary artery without angina pectoris: Secondary | ICD-10-CM

## 2010-11-18 DIAGNOSIS — Z951 Presence of aortocoronary bypass graft: Secondary | ICD-10-CM

## 2010-11-18 DIAGNOSIS — Z0000 Encounter for general adult medical examination without abnormal findings: Secondary | ICD-10-CM

## 2010-11-18 LAB — CBC WITH DIFFERENTIAL/PLATELET
Basophils Absolute: 0 10*3/uL (ref 0.0–0.1)
Basophils Relative: 0.6 % (ref 0.0–3.0)
Eosinophils Absolute: 0.1 10*3/uL (ref 0.0–0.7)
Hemoglobin: 13.7 g/dL (ref 12.0–15.0)
Lymphocytes Relative: 31.6 % (ref 12.0–46.0)
MCHC: 33.9 g/dL (ref 30.0–36.0)
Monocytes Relative: 7.9 % (ref 3.0–12.0)
Neutrophils Relative %: 58.5 % (ref 43.0–77.0)
RBC: 4.44 Mil/uL (ref 3.87–5.11)

## 2010-11-18 LAB — VITAMIN B12: Vitamin B-12: 342 pg/mL (ref 211–911)

## 2010-11-18 LAB — T4, FREE: Free T4: 0.77 ng/dL (ref 0.60–1.60)

## 2010-11-18 MED ORDER — TETANUS-DIPHTH-ACELL PERTUSSIS 5-2.5-18.5 LF-MCG/0.5 IM SUSP: 0.5000 mL | Freq: Once | INTRAMUSCULAR | Status: DC

## 2010-11-18 NOTE — Progress Notes (Signed)
Subjective:    Patient ID: [name], female    DOB: 1955/10/08, 55 y.o.   MRN: [id number]  HPI [name] presents to reestablish for care with last visit in '08. She has had real loss with mothers illness, brother with cancer, nephew robber her and she has entered menopause. She is c/o myalgia, weight gain, low energy and lack of exercise. She is current with dr. Antoine [name] and [name].   Past Medical History  Diagnosis Date  . 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 CORONARY ARTERY DISEASE 02/12/2007  . DYSLIPIDEMIA 02/12/2007  . GASTROESOPHAGEAL REFLUX DISEASE 02/12/2007  . Other abnormal blood chemistry 06/30/2007  . ROTATOR CUFF REPAIR, RIGHT, HX OF 02/12/2007  . WISDOM TEETH EXTRACTION, HX OF 02/12/2007   Past Surgical History  Procedure Date  . Tubal ligation   . Wisdom tooth extraction   . Rotator cuff repair     Right  . Laparoscopic surgery   . Coronary artery bypass graft [name] '07    2 vessel   Family History  Problem Relation Age of Onset  . Heart disease Mother   . Hyperlipidemia Mother   . Hypertension Mother   . Stroke Mother   . Depression Mother   . Pancreatitis Father   . Diabetes Maternal Aunt   . Cancer Maternal Aunt     breast - fatal  . Diabetes Maternal Grandmother   . Cancer Brother     rectal/colon and lung cancer   History   Social History  . Marital Status: Married    Spouse Name: N/A    Number of Children: N/A  . Years of Education: N/A   Occupational History  . Not on file.   Social History Main Topics  . Smoking status: Never Smoker   . Smokeless tobacco: Never Used  . Alcohol Use: Yes     rare - once a month  . Drug Use: No  . Sexually Active: Yes -- Female partner(s)   Other Topics Concern  . 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 Married 2002. No children. Worked at [employer], married the boss. She is now retired. Marriage is in good health.        Review of  Systems Review of Systems  Constitutional:  Negative for fever, chills, activity change. Unexpected weight change. Fluid retention HEENT:  Negative for hearing loss, ear pain, congestion, neck stiffness and postnasal drip. Negative for sore throat or swallowing problems. Negative for dental complaints.   Eyes: Negative for vision loss or change in visual acuity.  Respiratory: Negative for chest tightness and wheezing.   Cardiovascular: Negative for chest pain and palpitation. No decreased exercise tolerance Gastrointestinal: Change in bowel habit-constipation over the past several weeks. No bloating or gas. Mild reflux and indigestion Genitourinary: Negative for urgency, frequency, flank pain and difficulty urinating.  Musculoskeletal:Positive for myalgias, low back pain, arthralgias.  Neurological: Negative for dizziness, tremors, weakness and headaches.  Hematological: Negative for adenopathy.  Psychiatric/Behavioral: Negative for behavioral problems and dysphoric mood. Denies depression despite all the stress.       Objective:   Physical Exam Vitals noted - normal Gen'l - WNWD overweight white woman in no distress HEENT- [place]/AT, C&S clear, EACs/TMs normal Neck-supple with no thyromegaly Chest - no deformity, no CVAT Lungs - clear without rales or wheeze, no increased work of breathing Breast - deferred to gyn Abdomen - no guarding or rebound, no HSM Rectal/pelvic  deferred to gyn  Extremities - no deformity or abnormality Skin - clear Neuro - A&O x 3, [name] II-XII normal. Normal gait. Component     Latest Ref Rng 11/18/2010  WBC     4.5 - 10.5 K/uL 4.1 (L)  RBC     3.87 - 5.11 Mil/uL 4.44  HGB     12.0 - 15.0 g/dL 16.1  HCT     09.6 - 04.5 % 40.5  MCV     78.0 - 100.0 fl 91.3  MCHC     30.0 - 36.0 g/dL 40.9  RDW     81.1 - 91.4 % 13.9  Platelets     150.0 - 400.0 K/uL 194.0  Neutrophils Relative     43.0 - 77.0 % 58.5  Lymphocytes Relative     12.0 - 46.0 % 31.6    Monocytes Relative     3.0 - 12.0 % 7.9  Eosinophils Relative     0.0 - 5.0 % 1.4  Basophils Relative     0.0 - 3.0 % 0.6  Neutrophils Absolute     1.4 - 7.7 K/uL 2.4  Lymphocytes Absolute     0.7 - 4.0 K/uL 1.3  Monocytes Absolute     0.1 - 1.0 K/uL 0.3  Eosinophils Absolute     0.0 - 0.7 K/uL 0.1  Basophils Absolute     0.0 - 0.1 K/uL 0.0  TSH     0.35 - 5.50 uIU/mL 1.28  Vitamin B-12     211 - 911 pg/mL 342  Free T4     0.60 - 1.60 ng/dL 7.82           Assessment & Plan:

## 2010-11-19 DIAGNOSIS — Z0000 Encounter for general adult medical examination without abnormal findings: Secondary | ICD-10-CM | POA: Insufficient documentation

## 2010-11-19 NOTE — Assessment & Plan Note (Signed)
Good response to medical therapy with last LDL of 58 in March '12  Plan - continue present medication           Follow -up lab in March '13

## 2010-11-19 NOTE — Assessment & Plan Note (Signed)
S/p by-pass surgery. She was discovered to have occlusive disease prior to having any event and thus has normal cardiac function. She is followed by cardiology.  Plan - continue risk modification.

## 2010-11-19 NOTE — Assessment & Plan Note (Deleted)
Followed by cardiology. She is doing very well with a full and complete recovery with no limitations in her activities.  Plan - continued risk modification.

## 2010-11-19 NOTE — Assessment & Plan Note (Signed)
Currently stable and doing well. She uses PPI on an as needed basis but not routinely

## 2010-11-19 NOTE — Assessment & Plan Note (Signed)
Patient has been medically stable with no  Serious medical problems over the past year. She has had a change in weight which she attributes to a combination of stress and menopause. She is current with her gynecologist for pelvic/PAP and breast exam. She is current with mammography. Last colonoscopy was Feb '08 due for follow-up 2013 to 2018 per gastroenterology. Immunizations: Tdap today.  Weight management: smart food choices among existing food preferences; PORTION SIZE CONTROL - hand a measure; regular exercise. Target weight 150 lbs; goal is to loose 1-2 lbs per month.  In summary - a very nice woman who appears to be medically stable. She is advised to return for general medical exam in 2 years otherwise to return as needed.

## 2010-11-20 ENCOUNTER — Other Ambulatory Visit (HOSPITAL_COMMUNITY): Payer: Self-pay | Admitting: Chiropractic Medicine

## 2010-11-20 ENCOUNTER — Ambulatory Visit (HOSPITAL_COMMUNITY)
Admission: RE | Admit: 2010-11-20 | Discharge: 2010-11-20 | Disposition: A | Discharge status: Home / Self Care | Payer: BC Managed Care – PPO | Source: Ambulatory Visit | Attending: Chiropractic Medicine | Admitting: Chiropractic Medicine

## 2010-11-20 DIAGNOSIS — M545 Low back pain, unspecified: Secondary | ICD-10-CM | POA: Insufficient documentation

## 2010-11-20 DIAGNOSIS — R52 Pain, unspecified: Secondary | ICD-10-CM

## 2010-11-24 ENCOUNTER — Encounter: Payer: Self-pay | Admitting: Internal Medicine

## 2010-12-11 ENCOUNTER — Other Ambulatory Visit: Payer: PRIVATE HEALTH INSURANCE

## 2010-12-18 ENCOUNTER — Ambulatory Visit: Payer: PRIVATE HEALTH INSURANCE

## 2010-12-22 ENCOUNTER — Ambulatory Visit: Payer: Self-pay

## 2010-12-25 ENCOUNTER — Ambulatory Visit: Payer: PRIVATE HEALTH INSURANCE | Admitting: Internal Medicine

## 2011-01-05 ENCOUNTER — Ambulatory Visit: Payer: PRIVATE HEALTH INSURANCE | Admitting: Internal Medicine

## 2011-05-12 ENCOUNTER — Other Ambulatory Visit (INDEPENDENT_AMBULATORY_CARE_PROVIDER_SITE_OTHER): Payer: BC Managed Care – PPO

## 2011-05-12 ENCOUNTER — Ambulatory Visit (INDEPENDENT_AMBULATORY_CARE_PROVIDER_SITE_OTHER): Payer: BC Managed Care – PPO | Admitting: Internal Medicine

## 2011-05-12 ENCOUNTER — Encounter: Payer: Self-pay | Admitting: Internal Medicine

## 2011-05-12 VITALS — BP 118/80 | HR 72 | Temp 98.2°F | Resp 14 | Wt 170.0 lb

## 2011-05-12 DIAGNOSIS — M5430 Sciatica, unspecified side: Secondary | ICD-10-CM

## 2011-05-12 DIAGNOSIS — M2550 Pain in unspecified joint: Secondary | ICD-10-CM

## 2011-05-12 DIAGNOSIS — G4700 Insomnia, unspecified: Secondary | ICD-10-CM

## 2011-05-12 LAB — URIC ACID: Uric Acid, Serum: 4.5 mg/dL (ref 2.4–7.0)

## 2011-05-12 MED ORDER — GABAPENTIN 100 MG PO CAPS: 100.0000 mg | ORAL_CAPSULE | Freq: Every day | ORAL | Status: DC

## 2011-05-12 MED ORDER — SERTRALINE HCL 50 MG PO TABS: 50.0000 mg | ORAL_TABLET | Freq: Every day | ORAL | Status: DC

## 2011-05-12 NOTE — Progress Notes (Signed)
  Subjective:    Patient ID: [name], female    DOB: 03/26/1956, 55 y.o.   MRN: [id number]  HPI [name] has been having diffuse pain in the body and joints. She is seeing Dr. Melvyn Neth for her sciatica. She has a morning gel - 30 minutes hand and feet. She does report that the joints to get red and painful and she describes synovial thickening.   She is not sleeping well: multiple factors- sick family members, loss of her brother, menopausal.   Sciatica - has intermittent pain at night. Sometimes blocks sleep.   Past Medical History  Diagnosis Date  . 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 CORONARY ARTERY DISEASE 02/12/2007  . DYSLIPIDEMIA 02/12/2007  . GASTROESOPHAGEAL REFLUX DISEASE 02/12/2007  . Other abnormal blood chemistry 06/30/2007  . ROTATOR CUFF REPAIR, RIGHT, HX OF 02/12/2007  . WISDOM TEETH EXTRACTION, HX OF 02/12/2007   Past Surgical History  Procedure Date  . Tubal ligation   . Wisdom tooth extraction   . Rotator cuff repair     Right  . Laparoscopic surgery   . Coronary artery bypass graft Rana Snare '07    2 vessel   Family History  Problem Relation Age of Onset  . Heart disease Mother   . Hyperlipidemia Mother   . Hypertension Mother   . Stroke Mother   . Depression Mother   . Pancreatitis Father   . Diabetes Maternal Aunt   . Cancer Maternal Aunt     breast - fatal  . Diabetes Maternal Grandmother   . Cancer Brother     rectal/colon and lung cancer   History   Social History  . Marital Status: Married    Spouse Name: N/A    Number of Children: N/A  . Years of Education: N/A   Occupational History  . Not on file.   Social History Main Topics  . Smoking status: Never Smoker   . Smokeless tobacco: Never Used  . Alcohol Use: Yes     rare - once a month  . Drug Use: No  . Sexually Active: Yes -- Female partner(s)   Other Topics Concern  . 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 Married  2002. No children. Worked at [employer], married the boss. She is now retired. Marriage is in good health.        Review of Systems System review is negative for any constitutional, cardiac, pulmonary, GI or neuro symptoms or complaints other than as described in the HPI.     Objective:   Physical Exam Filed Vitals:   05/12/11 1112  BP: 118/80  Pulse: 72  Temp: 98.2 F (36.8 C)  Resp: 14   Gen'l- WNWD whie woman in no acute distress HEENT- C&S clear Pulm - normal respirations Cor - RRR Neuro - A&O x 3, normal gait, normal strength, CN II-XII normal Ext - no deformity of small or medium joints, no synovial thickening fingers, no erythema.      Assessment & Plan:

## 2011-05-12 NOTE — Patient Instructions (Signed)
Joint and body aches - will check for rheumatoid arthritis, gout and general inflammation by lab studies. For discomfort tylenol up to 1000 mg three times a day and if this fails it is ok to add aleve or other NSAIDs  Sleep- sounds like it is multi-factorial: menopause, anxiety and stress.  Plan - trial of sertraline (zoloft) 50 mg at bedtime. May increase to 100 mg if inadequate response.  Sciatica - continue with chiropractic/physical therapy, do the stretches every day - they make a major difference                 Trial of gabapentin starting at 100 mg at bedtime and we can certainly increase this if needed.   Colonoscopy - last study in '09 - diverticulosis only. Follow-up exam should be in 10 years and they have a good call back program.

## 2011-05-13 LAB — RHEUMATOID FACTOR: Rhuematoid fact SerPl-aCnc: <10 IU/mL (ref <=14)

## 2011-05-14 DIAGNOSIS — G4700 Insomnia, unspecified: Secondary | ICD-10-CM | POA: Insufficient documentation

## 2011-05-14 DIAGNOSIS — M5430 Sciatica, unspecified side: Secondary | ICD-10-CM | POA: Insufficient documentation

## 2011-05-14 DIAGNOSIS — M2550 Pain in unspecified joint: Secondary | ICD-10-CM | POA: Insufficient documentation

## 2011-05-14 NOTE — Assessment & Plan Note (Signed)
Multi-factorial including stress factors.  Plan - good sleep hygiene           Trial of sertraline 50 mg qHS, may advance to 100 mg if needed.

## 2011-05-14 NOTE — Assessment & Plan Note (Signed)
Seeing chiropractor with some relief.   Plan - continue home stretch exercise           Start gabapentin 100 mg qHS and can titrate up.

## 2011-05-14 NOTE — Assessment & Plan Note (Signed)
Diffuse multiple joint pain, especially with stiff hands.  Plan- labs ordered to r/o RA, inflammatory disease and connective tissue disease.  Addendum: RF negative, ESR 12, Uric acid 4.9

## 2011-05-15 ENCOUNTER — Telehealth: Payer: Self-pay | Admitting: *Deleted

## 2011-05-15 ENCOUNTER — Encounter: Payer: Self-pay | Admitting: Internal Medicine

## 2011-05-15 NOTE — Telephone Encounter (Signed)
Pt states that at OV this week MD prescribed Gabpentin and Zoloft to help with pain and sleeplessness. She states that medications are not working-still experiencing pain and is only sleeping about 1 1/2 hours at a time and is continue to wake up during the middle of the night-pt is asking for MD's advisement regarding rx's.

## 2011-05-18 NOTE — Telephone Encounter (Signed)
Continue sertraline 50 mg. It hasn't been long enough to know the full benefit.  Gabapentin - increase to 200 mg at bedtime, if not enough relief at 3 days then start 200 mg AM and HS, if not enough relief at 3 days then start 200 mg AM, PM and HS. If not enough relief at 3 days may go to 300 mg AM, PM and HS. It is ok to call in refill Rx for this medication if needed while we titrate up the dose.

## 2011-05-19 NOTE — Telephone Encounter (Signed)
LMOM for patient call back w/contact name & direct number.

## 2011-05-19 NOTE — Telephone Encounter (Signed)
Left 2nd msge for Pt-will call back 02.06.13

## 2011-05-21 NOTE — Telephone Encounter (Signed)
3rd call--LMOM w/beginning of instructions to inform patient as to MD start advice regarding Rx; call back contact name & number.

## 2011-05-26 NOTE — Telephone Encounter (Signed)
LMOM [4th]--will mail copy of MDs recommendations to patient today.

## 2011-06-03 ENCOUNTER — Other Ambulatory Visit: Payer: Self-pay | Admitting: *Deleted

## 2011-06-03 MED ORDER — GABAPENTIN 100 MG PO CAPS: 200.0000 mg | ORAL_CAPSULE | Freq: Two times a day (BID) | ORAL | Status: DC

## 2011-06-03 NOTE — Progress Notes (Signed)
Per MEN orders in 02.01.13 note/SLS LMOM to inform patient.

## 2011-06-09 ENCOUNTER — Encounter: Payer: Self-pay | Admitting: Internal Medicine

## 2011-06-09 ENCOUNTER — Ambulatory Visit (INDEPENDENT_AMBULATORY_CARE_PROVIDER_SITE_OTHER): Payer: BC Managed Care – PPO | Admitting: Internal Medicine

## 2011-06-09 DIAGNOSIS — G4700 Insomnia, unspecified: Secondary | ICD-10-CM

## 2011-06-09 NOTE — Assessment & Plan Note (Signed)
Did ok with zoloft but had side effects that included stuffiness and somnolence  Plan - stop zoloft           Call back 2 weeks  - if recurrent sleep problem will try prozac. If the side effects return will try a benzo.

## 2011-06-09 NOTE — Progress Notes (Signed)
  Subjective:    Patient ID: [name], female    DOB: [birth year], 56 y.o.   MRN: [id number]  HPI [name] presents for follow-up of use of sertraline - she is sleeping better but feels tired all the time and feels like her head is stuffy like a cold.  She is having loose stools - Medifast for weight loss.  PMH, FamHx and SocHx reviewed for any changes and relevance.    Review of Systems System review is negative for any constitutional, cardiac, pulmonary, GI or neuro symptoms or complaints other than as described in the HPI.     Objective:   Physical Exam Filed Vitals:   06/09/11 0936  BP: 118/82  Pulse: 74  Temp: 96.8 F (36 C)  Resp: 14   Gen'l- WNWD white woman Respirations - normal Cor - RRR Psych - alert, calm.       Assessment & Plan:  Loose stools - no evidence to suggest infection or GI problem  Plan - bulk laxative

## 2011-06-09 NOTE — Patient Instructions (Signed)
In regard to sertraline for insomnia - glad it is working but the side effects sound bothersome.   Plan - stop the sertraline. It will gradually clear out of your system. After 1-2 weeks let's see how you are doing. If stress returns and interferes with your sleep we can try another product.  Loose stools - does not sound pathologic.  Plan - take a "bulk" laxative once or twice a day to give a better formed, bulkier stool that is easy to pass. Products: metamucil, Benefiber, etc.

## 2011-10-09 ENCOUNTER — Other Ambulatory Visit: Payer: Self-pay | Admitting: *Deleted

## 2011-10-09 MED ORDER — GABAPENTIN 100 MG PO CAPS: 200.0000 mg | ORAL_CAPSULE | Freq: Two times a day (BID) | ORAL | Status: DC

## 2011-10-09 NOTE — Telephone Encounter (Signed)
[facility]

## 2011-11-26 ENCOUNTER — Ambulatory Visit: Payer: BC Managed Care – PPO | Admitting: Cardiology

## 2011-12-01 ENCOUNTER — Telehealth: Payer: Self-pay | Admitting: Cardiology

## 2011-12-01 NOTE — Telephone Encounter (Signed)
Pt has been working out in a gyn since March of this year.  Recently she started lifting weights and has noticed a pain in her chest at the surgical site with specific movements only.  This also occurs with jumping jacks.  She reports hearing a clicking sound on occasion.  Requested pt contact surgeon if this continues.  Instructed her to use anti-inflammatory for muscle pain.  Pt is in agreement. She will call back if pain gets worse or changes.

## 2011-12-01 NOTE — Telephone Encounter (Signed)
New problem:  Patient calling have been  working out 2-3 weeks - weight lifting. Notice when  she turns a certain way in her chest  its hurts. Only hurts after the fact .   Lifting 30 lbs each arm.

## 2011-12-31 ENCOUNTER — Encounter: Payer: Self-pay | Admitting: Cardiology

## 2011-12-31 ENCOUNTER — Ambulatory Visit (INDEPENDENT_AMBULATORY_CARE_PROVIDER_SITE_OTHER): Payer: BC Managed Care – PPO | Admitting: Cardiology

## 2011-12-31 VITALS — BP 128/80 | HR 71 | Ht 64.0 in | Wt 173.8 lb

## 2011-12-31 DIAGNOSIS — I2510 Atherosclerotic heart disease of native coronary artery without angina pectoris: Secondary | ICD-10-CM

## 2011-12-31 DIAGNOSIS — E785 Hyperlipidemia, unspecified: Secondary | ICD-10-CM

## 2011-12-31 NOTE — Patient Instructions (Addendum)
The current medical regimen is effective;  continue present plan and medications.  Your physician has requested that you have an exercise tolerance test. For further information please visit [email or web address] Please also follow instruction sheet, as given.  Please have fasting lipid profile at next office visit.  Follow up in 1 year with Dr Antoine Poche.  You will receive a letter in the mail 2 months before you are due.  Please call us when you receive this letter to schedule your follow up appointment.

## 2011-12-31 NOTE — Progress Notes (Signed)
HPI The patient presents for followup of known coronary disease. Since I last saw her she has had no cardiovascular problems. She has had some tragedy with a brother and a good friend dying of cancer. Because of this she did cancel plan treadmill test I last saw her. She had gained some weight. However, fairly recently she's been exercising with a trainer. With this she denies any cardiovascular symptoms. The patient denies any new symptoms such as chest discomfort, neck or arm discomfort. There has been no new shortness of breath, PND or orthopnea. There have been no reported palpitations, presyncope or syncope.  Allergies  Allergen Reactions  . Codeine     Current Outpatient Prescriptions  Medication Sig Dispense Refill  . aspirin 81 MG tablet Take 81 mg by mouth daily. Women's aspirin w/calcium.      Marland Kitchen atorvastatin (LIPITOR) 10 MG tablet Take 1 tablet (10 mg total) by mouth daily.  90 tablet  3  . co-enzyme Q-10 30 MG capsule Take 30 mg by mouth daily.      . fenofibrate 160 MG tablet Take 1 tablet (160 mg total) by mouth daily.  90 tablet  3  . fish oil-omega-3 fatty acids 1000 MG capsule Take 1 g by mouth daily. Take 8 tabs daily      . Hyaluronic Acid-Vitamin C (HYALURONIC ACID PO) Take 100 mg by mouth 2 (two) times daily.      . Lactobacillus-Inulin (CULTURELLE DIGESTIVE HEALTH PO) Take by mouth daily.      . Magnesium Gluconate (MAG-G PO) Take by mouth daily. 2 cap daily      . metoprolol tartrate (LOPRESSOR) 25 MG tablet Take 25 mg by mouth daily. 1/2 tablet bid       . Milk Thistle-Turmeric (SILYMARIN PO) Take 150 mg by mouth 2 (two) times daily. 2 cap daily      . Multiple Vitamins-Minerals (CENTRUM SILVER) tablet Take 1 tablet by mouth daily.         Current Facility-Administered Medications  Medication Dose Route Frequency Provider Last Rate Last Dose  . TDaP (BOOSTRIX) injection 0.5 mL  0.5 mL Intramuscular Once Jacques Navy, MD        Past Medical History    Diagnosis Date  . 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 CORONARY ARTERY DISEASE 02/12/2007  . DYSLIPIDEMIA 02/12/2007  . GASTROESOPHAGEAL REFLUX DISEASE 02/12/2007  . Other abnormal blood chemistry 06/30/2007  . ROTATOR CUFF REPAIR, RIGHT, HX OF 02/12/2007  . WISDOM TEETH EXTRACTION, HX OF 02/12/2007    Past Surgical History  Procedure Date  . Tubal ligation   . Wisdom tooth extraction   . Rotator cuff repair     Right  . Laparoscopic surgery   . Coronary artery bypass graft Rana Snare '07    2 vessel    ROS:  As stated in the HPI and negative for all other systems.  PHYSICAL EXAM BP 128/80  Pulse 71  Ht 5\' 4"  (1.626 m)  Wt 173 lb 12.8 oz (78.835 kg)  BMI 29.83 kg/m2 GENERAL:  Well appearing HEENT:  Pupils equal round and reactive, fundi not visualized, oral mucosa unremarkable NECK:  No jugular venous distention, waveform within normal limits, carotid upstroke brisk and symmetric, no bruits, no thyromegaly LYMPHATICS:  No cervical, inguinal adenopathy LUNGS:  Clear to auscultation bilaterally BACK:  No CVA tenderness CHEST:  Well healed sternotomy scar. HEART:  PMI not displaced or sustained,S1 and S2 within normal limits, no S3, no  S4, no clicks, no rubs, no murmurs ABD:  Flat, positive bowel sounds normal in frequency in pitch, no bruits, no rebound, no guarding, no midline pulsatile mass, no hepatomegaly, no splenomegaly EXT:  2 plus pulses throughout, no edema, no cyanosis no clubbing   EKG:  Sinus rhythm, rate 71, axis within normal limits, intervals within normal limits, no acute ST-T wave changes.   ASSESSMENT AND PLAN  CORONARY ARTERY BYPASS GRAFT, HX OF  She is having no new symptoms. It has been 6 years since her bypass graft. I will bring the patient back for a POET (Plain Old Exercise Test). This will allow me to screen for obstructive coronary disease, risk stratify and very importantly provide a prescription for exercise.  DYSLIPIDEMIA  I will get  a fasting lipid profile with a goal LDL less than 70 and HDL greater than 50. Last year her HDL was 47.8 with an LDL of 50.

## 2012-01-04 ENCOUNTER — Other Ambulatory Visit: Payer: BC Managed Care – PPO

## 2012-01-14 ENCOUNTER — Other Ambulatory Visit (INDEPENDENT_AMBULATORY_CARE_PROVIDER_SITE_OTHER): Payer: BC Managed Care – PPO

## 2012-01-14 DIAGNOSIS — E785 Hyperlipidemia, unspecified: Secondary | ICD-10-CM

## 2012-01-14 LAB — LIPID PANEL
Cholesterol: 299 mg/dL — ABNORMAL HIGH (ref 0–200)
Total CHOL/HDL Ratio: 7
Triglycerides: 387 mg/dL — ABNORMAL HIGH (ref 0.0–149.0)
VLDL: 77.4 mg/dL — ABNORMAL HIGH (ref 0.0–40.0)

## 2012-01-19 ENCOUNTER — Encounter (HOSPITAL_COMMUNITY): Payer: BC Managed Care – PPO

## 2012-01-19 ENCOUNTER — Encounter: Payer: BC Managed Care – PPO | Admitting: Physician Assistant

## 2012-01-19 ENCOUNTER — Ambulatory Visit (INDEPENDENT_AMBULATORY_CARE_PROVIDER_SITE_OTHER): Payer: BC Managed Care – PPO | Admitting: Physician Assistant

## 2012-01-19 ENCOUNTER — Ambulatory Visit (HOSPITAL_COMMUNITY): Payer: BC Managed Care – PPO | Attending: Cardiology

## 2012-01-19 ENCOUNTER — Telehealth: Payer: Self-pay | Admitting: *Deleted

## 2012-01-19 DIAGNOSIS — E7800 Pure hypercholesterolemia, unspecified: Secondary | ICD-10-CM

## 2012-01-19 DIAGNOSIS — I25810 Atherosclerosis of coronary artery bypass graft(s) without angina pectoris: Secondary | ICD-10-CM

## 2012-01-19 DIAGNOSIS — R0989 Other specified symptoms and signs involving the circulatory and respiratory systems: Secondary | ICD-10-CM

## 2012-01-19 DIAGNOSIS — I2510 Atherosclerotic heart disease of native coronary artery without angina pectoris: Secondary | ICD-10-CM

## 2012-01-19 MED ORDER — ATORVASTATIN CALCIUM 20 MG PO TABS: 20.0000 mg | ORAL_TABLET | Freq: Every day | ORAL | Status: DC

## 2012-01-19 NOTE — Progress Notes (Signed)
Exercise Treadmill Test  Pre-Exercise Testing Evaluation Rhythm: normal sinus  Rate: 72   PR:  .17 QRS:  .09  QT:  .41 QTc: .44           Test  Exercise Tolerance Test Ordering MD: Angelina Sheriff, MD  Interpreting MD: S.Miquela Costabile PA  Unique Test No: 1 Treadmill:  1  Indication for ETT: Graft patentency  Contraindication to ETT: No   Stress Modality: exercise - treadmill  Cardiac Imaging Performed: non   Protocol: standard Bruce - maximal  Max BP:  131/69  Max MPHR (bpm):  166 85% MPR (bpm):  141  MPHR obtained (bpm):  142 % MPHR obtained:  85  Reached 85% MPHR (min:sec):  9:54 Total Exercise Time (min-sec): 10:01  Workload in METS:  11.9 Borg Scale: 19  Reason ETT Terminated:  patient's desire to stop    ST Segment Analysis At Rest: normal ST segments - no evidence of significant ST depression With Exercise: no evidence of significant ST depression  Other Information Arrhythmia:  No Angina during ETT:  absent (0) Quality of ETT:  diagnostic  ETT Interpretation:  normal - no evidence of ischemia by ST analysis  Comments: Good exercise tolerance. No chest pain. Normal BP response to exercise. No significant ST-T changes to suggest ischemia. She does have baseline ST changes.  Reviewed with Dr. Rollene Rotunda as well today.  Recommendations: Follow up with Dr. Rollene Rotunda as directed. Tereso Newcomer, PA-C  3:50 PM 01/19/2012

## 2012-01-19 NOTE — Telephone Encounter (Signed)
Called pt with results of lipid profile.  She will increase her Lipitor as instructed and repeat lab in 8 weeks.

## 2012-02-11 ENCOUNTER — Other Ambulatory Visit: Payer: Self-pay | Admitting: Cardiology

## 2012-02-11 NOTE — Telephone Encounter (Signed)
..   Requested Prescriptions   Pending Prescriptions Disp Refills  . fenofibrate 160 MG tablet [Pharmacy Med Name: FENOFIBRATE 160 MG TABLET] 90 tablet 3    Sig: TAKE 1 TABLET (160 MG TOTAL) BY MOUTH DAILY.

## 2012-03-15 ENCOUNTER — Other Ambulatory Visit (INDEPENDENT_AMBULATORY_CARE_PROVIDER_SITE_OTHER): Payer: BC Managed Care – PPO

## 2012-03-15 ENCOUNTER — Encounter: Payer: Self-pay | Admitting: *Deleted

## 2012-03-15 DIAGNOSIS — E7800 Pure hypercholesterolemia, unspecified: Secondary | ICD-10-CM

## 2012-03-15 LAB — LIPID PANEL
HDL: 47.3 mg/dL (ref >39.00)
Total CHOL/HDL Ratio: 3
Triglycerides: 231 mg/dL — ABNORMAL HIGH (ref 0.0–149.0)
VLDL: 46.2 mg/dL — ABNORMAL HIGH (ref 0.0–40.0)

## 2012-03-15 LAB — LDL CHOLESTEROL, DIRECT: Direct LDL: 53.3 mg/dL

## 2012-03-21 ENCOUNTER — Encounter: Payer: Self-pay | Admitting: *Deleted

## 2012-04-27 ENCOUNTER — Encounter: Payer: Self-pay | Admitting: Gastroenterology

## 2012-05-02 ENCOUNTER — Encounter: Payer: Self-pay | Admitting: Gastroenterology

## 2012-06-13 ENCOUNTER — Telehealth: Payer: Self-pay | Admitting: Cardiology

## 2012-06-13 NOTE — Telephone Encounter (Signed)
New problem     Since feb 20th trying to get rx.     Metoprolol  25 mg   [facility] [address]

## 2012-06-15 ENCOUNTER — Other Ambulatory Visit: Payer: Self-pay

## 2012-06-15 MED ORDER — METOPROLOL TARTRATE 25 MG PO TABS: ORAL_TABLET | ORAL | Status: DC

## 2012-06-15 NOTE — Telephone Encounter (Signed)
..   Requested Prescriptions   Signed Prescriptions Disp Refills  . metoprolol tartrate (LOPRESSOR) 25 MG tablet 15 tablet 6    Sig: Take 1/2 tablet twice a day    Authorizing Assad Harbeson: Rollene Rotunda    Ordering User: Christella Hartigan, ROSE Judie Petit

## 2012-09-14 ENCOUNTER — Other Ambulatory Visit: Payer: Self-pay | Admitting: Cardiology

## 2012-09-14 NOTE — Telephone Encounter (Signed)
..   Requested Prescriptions   Pending Prescriptions Disp Refills  . metoprolol tartrate (LOPRESSOR) 25 MG tablet [Pharmacy Med Name: METOPROLOL TARTRATE 25 MG TAB] 30 tablet 3    Sig: TAKE 1/2 TABLET TWICE A DAY

## 2012-09-15 ENCOUNTER — Other Ambulatory Visit: Payer: Self-pay | Admitting: Cardiology

## 2012-10-12 ENCOUNTER — Other Ambulatory Visit: Payer: Self-pay | Admitting: Internal Medicine

## 2012-11-08 ENCOUNTER — Encounter: Payer: Self-pay | Admitting: Gastroenterology

## 2012-12-08 ENCOUNTER — Telehealth: Payer: Self-pay | Admitting: Cardiology

## 2012-12-08 ENCOUNTER — Encounter: Payer: Self-pay | Admitting: Gastroenterology

## 2012-12-08 DIAGNOSIS — Z79899 Other long term (current) drug therapy: Secondary | ICD-10-CM

## 2012-12-08 DIAGNOSIS — E7800 Pure hypercholesterolemia, unspecified: Secondary | ICD-10-CM

## 2012-12-08 NOTE — Telephone Encounter (Signed)
New Prob     Pt requesting labs for her October appointment. Orders needed in EPIC.

## 2013-01-11 ENCOUNTER — Other Ambulatory Visit: Payer: Self-pay | Admitting: Cardiology

## 2013-01-13 ENCOUNTER — Other Ambulatory Visit: Payer: Self-pay | Admitting: Cardiology

## 2013-01-16 ENCOUNTER — Other Ambulatory Visit: Payer: Self-pay

## 2013-01-17 ENCOUNTER — Other Ambulatory Visit (INDEPENDENT_AMBULATORY_CARE_PROVIDER_SITE_OTHER): Payer: Self-pay

## 2013-01-17 DIAGNOSIS — Z79899 Other long term (current) drug therapy: Secondary | ICD-10-CM

## 2013-01-17 DIAGNOSIS — E7800 Pure hypercholesterolemia, unspecified: Secondary | ICD-10-CM

## 2013-01-17 LAB — LIPID PANEL
Cholesterol: 131 mg/dL (ref 0–200)
VLDL: 24 mg/dL (ref 0.0–40.0)

## 2013-01-18 ENCOUNTER — Other Ambulatory Visit: Payer: Self-pay | Admitting: Cardiology

## 2013-01-23 ENCOUNTER — Ambulatory Visit (INDEPENDENT_AMBULATORY_CARE_PROVIDER_SITE_OTHER): Payer: BC Managed Care – PPO | Admitting: Cardiology

## 2013-01-23 ENCOUNTER — Encounter: Payer: Self-pay | Admitting: Cardiology

## 2013-01-23 VITALS — BP 122/67 | HR 67 | Ht 64.0 in | Wt 174.0 lb

## 2013-01-23 DIAGNOSIS — E7800 Pure hypercholesterolemia, unspecified: Secondary | ICD-10-CM

## 2013-01-23 DIAGNOSIS — I2510 Atherosclerotic heart disease of native coronary artery without angina pectoris: Secondary | ICD-10-CM

## 2013-01-23 MED ORDER — ATORVASTATIN CALCIUM 20 MG PO TABS: 20.0000 mg | ORAL_TABLET | Freq: Every day | ORAL | Status: DC

## 2013-01-23 MED ORDER — METOPROLOL TARTRATE 25 MG PO TABS: ORAL_TABLET | ORAL | Status: DC

## 2013-01-23 MED ORDER — FENOFIBRATE 160 MG PO TABS: ORAL_TABLET | ORAL | Status: DC

## 2013-01-23 NOTE — Progress Notes (Signed)
   HPI The patient presents for followup of known coronary disease. Since I last saw her she has had no cardiovascular problems. Her mother died earlier this year and this has been stressful. She had gained some weight. However, she's been exercising with a trainer again recently. With this she denies any cardiovascular symptoms. The patient denies any new symptoms such as chest discomfort, neck or arm discomfort. There has been no new shortness of breath, PND or orthopnea. There have been no reported palpitations, presyncope or syncope.  Allergies  Allergen Reactions  . Codeine     Current Outpatient Prescriptions  Medication Sig Dispense Refill  . aspirin 81 MG tablet Take 81 mg by mouth daily. Women's aspirin w/calcium.      Marland Kitchen atorvastatin (LIPITOR) 20 MG tablet Take 1 tablet (20 mg total) by mouth daily.  90 tablet  3  . fenofibrate 160 MG tablet TAKE 1 TABLET (160 MG TOTAL) BY MOUTH DAILY.  90 tablet  3  . fish oil-omega-3 fatty acids 1000 MG capsule Take 1 g by mouth daily. Take 8 tabs daily      . Lactobacillus-Inulin (CULTURELLE DIGESTIVE HEALTH PO) Take by mouth daily.       Current Facility-Administered Medications  Medication Dose Route Frequency Provider Last Rate Last Dose  . TDaP (BOOSTRIX) injection 0.5 mL  0.5 mL Intramuscular Once Jacques Navy, MD        Past Medical History  Diagnosis Date  . 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 CORONARY ARTERY DISEASE 02/12/2007  . DYSLIPIDEMIA 02/12/2007  . GASTROESOPHAGEAL REFLUX DISEASE 02/12/2007  . Other abnormal blood chemistry 06/30/2007  . ROTATOR CUFF REPAIR, RIGHT, HX OF 02/12/2007  . WISDOM TEETH EXTRACTION, HX OF 02/12/2007    Past Surgical History  Procedure Laterality Date  . Tubal ligation    . Wisdom tooth extraction    . Rotator cuff repair      Right  . Laparoscopic surgery    . Coronary artery bypass graft  [name] '07    2 vessel    ROS:  As stated in the HPI and negative for all other  systems.  PHYSICAL EXAM BP 122/67  Pulse 67  Ht 5\' 4"  (1.626 m)  Wt 174 lb (78.926 kg)  BMI 29.85 kg/m2 GENERAL:  Well appearing NECK:  No jugular venous distention, waveform within normal limits, carotid upstroke brisk and symmetric, no bruits, no thyromegaly LYMPHATICS:  No cervical, inguinal adenopathy LUNGS:  Clear to auscultation bilaterally BACK:  No CVA tenderness CHEST:  Well healed sternotomy scar. HEART:  PMI not displaced or sustained,S1 and S2 within normal limits, no S3, no S4, no clicks, no rubs, no murmurs ABD:  Flat, positive bowel sounds normal in frequency in pitch, no bruits, no rebound, no guarding, no midline pulsatile mass, no hepatomegaly, no splenomegaly EXT:  2 plus pulses throughout, no edema, no cyanosis no clubbing   EKG:  Sinus rhythm, rate 67, axis within normal limits, intervals within normal limits, no acute ST-T wave changes. 01/23/2013   ASSESSMENT AND PLAN  CORONARY ARTERY BYPASS GRAFT, HX OF  She is having no new symptoms since the stress test last year.  The patient has no new sypmtoms.  No further cardiovascular testing is indicated.  We will continue with aggressive risk reduction and meds as listed.  DYSLIPIDEMIA  She had an excellent lipid profile.  She is off of the fish oil.  I will continue the fenofibrate however.

## 2013-01-23 NOTE — Patient Instructions (Signed)
The current medical regimen is effective;  continue present plan and medications.  Follow up in 1 year with Dr Hochrein.  You will receive a letter in the mail 2 months before you are due.  Please call us when you receive this letter to schedule your follow up appointment.  

## 2013-01-24 ENCOUNTER — Other Ambulatory Visit: Payer: Self-pay | Admitting: Cardiology

## 2013-02-09 ENCOUNTER — Other Ambulatory Visit: Payer: Self-pay | Admitting: Cardiology

## 2013-02-14 ENCOUNTER — Ambulatory Visit (AMBULATORY_SURGERY_CENTER): Payer: Self-pay | Admitting: *Deleted

## 2013-02-14 ENCOUNTER — Encounter: Payer: Self-pay | Admitting: Gastroenterology

## 2013-02-14 VITALS — Ht 64.0 in | Wt 171.6 lb

## 2013-02-14 DIAGNOSIS — Z800 Family history of malignant neoplasm of digestive organs: Secondary | ICD-10-CM

## 2013-02-14 MED ORDER — NA SULFATE-K SULFATE-MG SULF 17.5-3.13-1.6 GM/177ML PO SOLN: 1.0000 | Freq: Once | ORAL | Status: DC

## 2013-02-14 NOTE — Progress Notes (Signed)
No allergies to eggs or soy. No problems with anesthesia.  

## 2013-02-28 ENCOUNTER — Ambulatory Visit (AMBULATORY_SURGERY_CENTER): Payer: BC Managed Care – PPO | Admitting: Gastroenterology

## 2013-02-28 ENCOUNTER — Encounter: Payer: Self-pay | Admitting: Gastroenterology

## 2013-02-28 VITALS — BP 100/69 | HR 56 | Temp 96.4°F | Resp 14 | Ht 64.0 in | Wt 171.0 lb

## 2013-02-28 DIAGNOSIS — Z800 Family history of malignant neoplasm of digestive organs: Secondary | ICD-10-CM

## 2013-02-28 DIAGNOSIS — Z1211 Encounter for screening for malignant neoplasm of colon: Secondary | ICD-10-CM

## 2013-02-28 DIAGNOSIS — K5730 Diverticulosis of large intestine without perforation or abscess without bleeding: Secondary | ICD-10-CM

## 2013-02-28 MED ORDER — SODIUM CHLORIDE 0.9 % IV SOLN: 500.0000 mL | INTRAVENOUS | Status: DC

## 2013-02-28 NOTE — Op Note (Signed)
[facility] [address] [place], [address]   COLONOSCOPY PROCEDURE REPORT  PATIENT: [name], [name]  MR#: [id number] BIRTHDATE: 1955/11/10 , 57  yrs. old GENDER: Female ENDOSCOPIST: Louis Meckel, MD REFERRED BY: PROCEDURE DATE:  02/28/2013 PROCEDURE:   Colonoscopy, diagnostic First Screening Colonoscopy - Avg.  risk and is 50 yrs.  old or older - No.  Prior Negative Screening - Now for repeat screening. Above average risk  History of Adenoma - Now for follow-up colonoscopy & has been > or = to 3 yrs.  N/A  Polyps Removed Today? No.  Recommend repeat exam, <10 yrs? Yes.  High risk (family or personal hx). ASA CLASS:   Class III INDICATIONS:Patient's immediate family history of colon cancer. MEDICATIONS: MAC sedation, administered by CRNA and Propofol (Diprivan) 230 mg IV  DESCRIPTION OF PROCEDURE:   After the risks benefits and alternatives of the procedure were thoroughly explained, informed consent was obtained.  A digital rectal exam revealed no abnormalities of the rectum.   The LB [id number]  endoscope was introduced through the anus and advanced to the cecum, which was identified by both the appendix and ileocecal valve. No adverse events experienced.   The quality of the prep was excellent using Suprep  The instrument was then slowly withdrawn as the colon was fully examined.      COLON FINDINGS: Mild diverticulosis was noted in the sigmoid colon. The colon was otherwise normal.  There was no diverticulosis, inflammation, polyps or cancers unless previously stated. Retroflexed views revealed no abnormalities. The time to cecum=2 minutes 38 seconds.  Withdrawal time=7 minutes 35 seconds.  The scope was withdrawn and the procedure completed. COMPLICATIONS: There were no complications.  ENDOSCOPIC IMPRESSION: 1.   Mild diverticulosis was noted in the sigmoid colon 2.   The colon was otherwise normal  RECOMMENDATIONS: Given your  significant family history of colon cancer, you should have a repeat colonoscopy in 5 years   eSigned:  Louis Meckel, MD 02/28/2013 8:46 AM   cc: Jacques Navy, MD   PATIENT NAME:  [name], [name] MR#: [id number]

## 2013-02-28 NOTE — Patient Instructions (Signed)
YOU HAD AN ENDOSCOPIC PROCEDURE TODAY AT THE  ENDOSCOPY CENTER: Refer to the procedure report that was given to you for any specific questions about what was found during the examination.  If the procedure report does not answer your questions, please call your gastroenterologist to clarify.  If you requested that your care partner not be given the details of your procedure findings, then the procedure report has been included in a sealed envelope for you to review at your convenience later.  YOU SHOULD EXPECT: Some feelings of bloating in the abdomen. Passage of more gas than usual.  Walking can help get rid of the air that was put into your GI tract during the procedure and reduce the bloating. If you had a lower endoscopy (such as a colonoscopy or flexible sigmoidoscopy) you may notice spotting of blood in your stool or on the toilet paper. If you underwent a bowel prep for your procedure, then you may not have a normal bowel movement for a few days.  DIET: Your first meal following the procedure should be a light meal and then it is ok to progress to your normal diet.  A half-sandwich or bowl of soup is an example of a good first meal.  Heavy or fried foods are harder to digest and may make you feel nauseous or bloated.  Likewise meals heavy in dairy and vegetables can cause extra gas to form and this can also increase the bloating.  Drink plenty of fluids but you should avoid alcoholic beverages for 24 hours.  ACTIVITY: Your care partner should take you home directly after the procedure.  You should plan to take it easy, moving slowly for the rest of the day.  You can resume normal activity the day after the procedure however you should NOT DRIVE or use heavy machinery for 24 hours (because of the sedation medicines used during the test).    SYMPTOMS TO REPORT IMMEDIATELY: A gastroenterologist can be reached at any hour.  During normal business hours, 8:30 AM to 5:00 PM Monday through Friday,  call [phone].  After hours and on weekends, please call the GI answering service at [phone] who will take a message and have the physician on call contact you.   Following lower endoscopy (colonoscopy or flexible sigmoidoscopy):  Excessive amounts of blood in the stool  Significant tenderness or worsening of abdominal pains  Swelling of the abdomen that is new, acute  Fever of 100F or higher  FOLLOW UP: If any biopsies were taken you will be contacted by phone or by letter within the next 1-3 weeks.  Call your gastroenterologist if you have not heard about the biopsies in 3 weeks.  Our staff will call the home number listed on your records the next business day following your procedure to check on you and address any questions or concerns that you may have at that time regarding the information given to you following your procedure. This is a courtesy call and so if there is no answer at the home number and we have not heard from you through the emergency physician on call, we will assume that you have returned to your regular daily activities without incident.  SIGNATURES/CONFIDENTIALITY: You and/or your care partner have signed paperwork which will be entered into your electronic medical record.  These signatures attest to the fact that that the information above on your After Visit Summary has been reviewed and is understood.  Full responsibility of the confidentiality of this   discharge information lies with you and/or your care-partner.  Recommendations See procedure report  

## 2013-02-28 NOTE — Progress Notes (Signed)
Patient did not have preoperative order for IV antibiotic SSI prophylaxis. (G8918)  Patient did not experience any of the following events: a burn prior to discharge; a fall within the facility; wrong site/side/patient/procedure/implant event; or a hospital transfer or hospital admission upon discharge from the facility. (G8907)  

## 2013-02-28 NOTE — Progress Notes (Signed)
Report to pacu rn, vss, bbs=clear 

## 2013-03-01 ENCOUNTER — Telehealth: Payer: Self-pay

## 2013-03-01 NOTE — Telephone Encounter (Signed)
  Follow up Call-  Call back number 02/28/2013  Post procedure Call Back phone  # [phone] cell  Permission to leave phone message Yes     Patient questions:  Do you have a fever, pain , or abdominal swelling? no Pain Score  0 *  Have you tolerated food without any problems? yes  Have you been able to return to your normal activities? yes  Do you have any questions about your discharge instructions: Diet   no Medications  no Follow up visit  no  Do you have questions or concerns about your Care? no  Actions: * If pain score is 4 or above: No action needed, pain <4.  No problems per the pt. Maw

## 2013-03-07 ENCOUNTER — Encounter: Payer: Self-pay | Admitting: Physician Assistant

## 2013-03-07 ENCOUNTER — Other Ambulatory Visit: Payer: BC Managed Care – PPO

## 2013-03-07 ENCOUNTER — Ambulatory Visit (INDEPENDENT_AMBULATORY_CARE_PROVIDER_SITE_OTHER): Payer: BC Managed Care – PPO | Admitting: Physician Assistant

## 2013-03-07 ENCOUNTER — Telehealth: Payer: Self-pay | Admitting: Gastroenterology

## 2013-03-07 VITALS — BP 112/68 | HR 86 | Ht 64.0 in | Wt 169.2 lb

## 2013-03-07 DIAGNOSIS — R197 Diarrhea, unspecified: Secondary | ICD-10-CM

## 2013-03-07 MED ORDER — SACCHAROMYCES BOULARDII 250 MG PO CAPS: 250.0000 mg | ORAL_CAPSULE | Freq: Two times a day (BID) | ORAL | Status: DC

## 2013-03-07 MED ORDER — METRONIDAZOLE 250 MG PO TABS: ORAL_TABLET | ORAL | Status: DC

## 2013-03-07 NOTE — Telephone Encounter (Signed)
Pt states that she has had diarrhea since her colonoscopy. Colon was done 11/18 and she states she has had diarrhea since then. Pt scheduled to see Mike Gip PA today at 11am. Pt aware of appt.

## 2013-03-07 NOTE — Progress Notes (Signed)
Subjective:    Patient ID: [name], female    DOB: April 24, 1955, 57 y.o.   MRN: [id number]  HPI  [name] is a pleasant 57 year old female who underwent colonoscopy with Dr. Arlyce Dice on 02/24/2013 for screening due to family history of colon cancer. She was found to have mild diverticulosis but an otherwise normal exam. She comes in today with complaints of diarrhea which has been present ever since her colonoscopy. She says she felt fine before the colonoscopy was not having any problems with diarrhea and now has had diarrhea over the past 10 days. She is having at least 6-8 bowel movements per day liquid to watery and malodorous. She has not noted any melena or hematochezia. She has had no fever or chills, no nausea or vomiting. She has been fatigued and somewhat "worn out" from the diarrhea. She is often being awakened early in the morning hours with urge for bowel movement. She's not been placed on any new medications or supplements and no recent antibiotics. Other medical problems include coronary artery disease for which she is status post bypass, chronic GERD and hyperlipidemia.    Review of Systems  Constitutional: Positive for fatigue.  HENT: Negative.   Eyes: Negative.   Cardiovascular: Negative.   Gastrointestinal: Positive for abdominal pain and diarrhea.  Endocrine: Negative.   Genitourinary: Negative.   Musculoskeletal: Negative.   Allergic/Immunologic: Negative.   Neurological: Negative.   Hematological: Negative.   Psychiatric/Behavioral: Negative.    Outpatient Prescriptions Prior to Visit  Medication Sig Dispense Refill  . aspirin 81 MG tablet Take 81 mg by mouth daily. Women's aspirin w/calcium.      Marland Kitchen atorvastatin (LIPITOR) 20 MG tablet Take 1 tablet (20 mg total) by mouth daily.  90 tablet  3  . fenofibrate 160 MG tablet TAKE 1 TABLET (160 MG TOTAL) BY MOUTH DAILY.  90 tablet  3  . Lactobacillus-Inulin (CULTURELLE DIGESTIVE HEALTH PO) Take by mouth daily.      Marland Kitchen  MELATONIN PO Take by mouth at bedtime.      . metoprolol tartrate (LOPRESSOR) 25 MG tablet TAKE 1/2 TABLET TWICE A DAY  90 tablet  3   Facility-Administered Medications Prior to Visit  Medication Dose Route Frequency Provider Last Rate Last Dose  . TDaP (BOOSTRIX) injection 0.5 mL  0.5 mL Intramuscular Once Jacques Navy, MD       Allergies  Allergen Reactions  . Codeine Itching and Rash        Patient Active Problem List   Diagnosis Date Noted  . Sciatica 05/14/2011  . Arthralgia 05/14/2011  . Insomnia 05/14/2011  . Routine health maintenance 11/19/2010  . OTHER ABNORMAL BLOOD CHEMISTRY 06/30/2007  . DYSLIPIDEMIA 02/12/2007  . CORONARY ARTERY DISEASE 02/12/2007  . GASTROESOPHAGEAL REFLUX DISEASE 02/12/2007  . WISDOM TEETH EXTRACTION, HX OF 02/12/2007  . ROTATOR CUFF REPAIR, RIGHT, HX OF 02/12/2007   History  Substance Use Topics  . Smoking status: Never Smoker   . Smokeless tobacco: Never Used  . Alcohol Use: Yes     Comment: rare - once a month   family history includes Cancer in her brother and maternal aunt; Colon cancer (age of onset: 107) in her brother; Colon cancer (age of onset: 76) in her paternal grandfather; Depression in her mother; Diabetes in her maternal aunt and maternal grandmother; Heart disease in her mother; Hyperlipidemia in her mother; Hypertension in her mother; Pancreatitis in her father; Stroke in her mother. There is no history of Esophageal cancer,  Rectal cancer, or Stomach cancer.  Objective:   Physical Exam   well-developed white female in no acute distress, pleasant blood pressure 112/68 pulse 86 height 5 foot 4 weight 169. HEENT ;nontraumatic normocephalic EOMI PERRLA sclera anicteric, Supple; no JVD, Cardiovascula;r regular rate and rhythm with S1-S2 no murmur or gallop she does have a sternal incisional scar, Pulmonary clear bilaterally, Abdomen; soft very minimally tender bilaterally in the lower quadrants there is no guarding or rebound no  palpable mass or hepatosplenomegaly bowel sounds are active, Rectal; exam not done, Extremities; no clubbing cyanosis or edema skin warm and dry, Psych; mood and affect normal and appropriate        Assessment & Plan:  #45  57 year old female with 10 day history of an acute diarrheal illness onset within 24 hours of colonoscopy. Suspect an acute infectious colitis, rule out C. Difficile #2 coronary artery disease status post CABG #3 hyperlipidemia #4 diverticulosis #5 family history of colon cancer  Plan; will check GI pathogen panel Start empiric Flagyl 250 mg by mouth 4 times daily x14 days Start Florastor one by mouth twice daily x2-3 weeks Low residue diet Further workup dependent on results of stool studies and response to above regimen

## 2013-03-07 NOTE — Progress Notes (Signed)
Reviewed and agree with management. Malvika Tung D. Dominque Levandowski, M.D., FACG  

## 2013-03-07 NOTE — Patient Instructions (Signed)
Go to the lab, basement level for stool study. We have given you a Low Fiber/Low Residue diet brochure. We sent prescriptions to [facility] [address] 1. Flagyl ( Metronidazole ). Antibiotic 2. FLorastor   You can take the probiotic you have for 2 weeks.

## 2013-03-08 ENCOUNTER — Telehealth: Payer: Self-pay | Admitting: Internal Medicine

## 2013-03-08 LAB — GASTROINTESTINAL PATHOGEN PANEL PCR
C. difficile Tox A/B, PCR: POSITIVE — CR
Campylobacter, PCR: NEGATIVE
Cryptosporidium, PCR: NEGATIVE
E coli (ETEC) LT/ST PCR: NEGATIVE
E coli (STEC) stx1/stx2, PCR: NEGATIVE
E coli 0157, PCR: NEGATIVE
Norovirus, PCR: NEGATIVE
Rotavirus A, PCR: NEGATIVE
Salmonella, PCR: NEGATIVE

## 2013-03-08 NOTE — Telephone Encounter (Signed)
Called by lab tonight with + GI pathogen panel for C diff. She was started on metronidazole by Mike Gip, PA-C 2 days ago after an office visit I told her by message that this medication is appropriate for c diff infection and advised that she take it until completed I also gave her the office number as a way to contact me or another MD over the holiday weekend with any questions or concerns I will alert Dr. Arlyce Dice and Mike Gip to this call

## 2013-03-10 ENCOUNTER — Other Ambulatory Visit: Payer: Self-pay | Admitting: Cardiology

## 2013-03-13 ENCOUNTER — Telehealth: Payer: Self-pay | Admitting: Gastroenterology

## 2013-03-13 NOTE — Telephone Encounter (Signed)
Left message for pt to call back  °

## 2013-03-14 NOTE — Telephone Encounter (Signed)
Spoke with patient and she is still having diarrhea but not as frequently as before. Only has 3-4 stools now.

## 2013-03-14 NOTE — Telephone Encounter (Signed)
Left a message for patient to call me. 

## 2013-03-14 NOTE — Telephone Encounter (Signed)
Ok- if diarrhea is not  Completely resolved when she finishes the 14 day course of Flagyl she needs to call  for advice-thanks

## 2013-03-14 NOTE — Telephone Encounter (Signed)
[name]- please call pt and see how she is doing- on Flagyl for cdiff.Marland KitchenMarland KitchenMarland Kitchen

## 2013-03-15 NOTE — Telephone Encounter (Signed)
Patient given Mike Gip, PA recommendation. She will call back if she has diarrhea after completing the Flagyl.

## 2013-03-15 NOTE — Telephone Encounter (Signed)
Pt states she spoke with Jesse Fall RN and already had her questions answered.

## 2013-04-18 ENCOUNTER — Telehealth: Payer: Self-pay | Admitting: Gastroenterology

## 2013-04-18 DIAGNOSIS — R197 Diarrhea, unspecified: Secondary | ICD-10-CM

## 2013-04-18 NOTE — Telephone Encounter (Signed)
Patient states she completed her Flagyl on 03/22/13 for C. diff. States her stools were more solid then but not normal. She reports she is having watery diarrhea for the last few days.(3-4 watery stools/Day) She has been taking Mucinex for sinus problems for the last few days also. Please, advise.

## 2013-04-19 NOTE — Telephone Encounter (Signed)
Spoke with patient and gave her Mike Gipmy [place], [place] recommendations. Lab in [facility].

## 2013-04-19 NOTE — Telephone Encounter (Signed)
Left a message for patient to call me. 

## 2013-04-19 NOTE — Telephone Encounter (Signed)
Mucinex shouldn't cause diarrhea- would ask her to repeat  Stool for cdiff PCR- before putting her back on ABX

## 2013-04-20 ENCOUNTER — Telehealth: Payer: Self-pay | Admitting: Physician Assistant

## 2013-04-20 NOTE — Telephone Encounter (Signed)
Spoke with patient and she will return a stool sample next week.

## 2014-01-26 ENCOUNTER — Ambulatory Visit (INDEPENDENT_AMBULATORY_CARE_PROVIDER_SITE_OTHER): Payer: BC Managed Care – PPO | Admitting: Cardiology

## 2014-01-26 ENCOUNTER — Encounter: Payer: Self-pay | Admitting: Cardiology

## 2014-01-26 VITALS — BP 131/78 | HR 58 | Ht 63.0 in | Wt 178.0 lb

## 2014-01-26 DIAGNOSIS — I2510 Atherosclerotic heart disease of native coronary artery without angina pectoris: Secondary | ICD-10-CM

## 2014-01-26 NOTE — Progress Notes (Signed)
HPI The patient presents for followup of known coronary disease. Since I last saw her she has had no cardiovascular problems. She's been continuing to exercise with a trainer. With this she denies any cardiovascular symptoms. The patient denies any new symptoms such as chest discomfort, neck or arm discomfort. There has been no new shortness of breath, PND or orthopnea. There have been no reported palpitations, presyncope or syncope.  Allergies  Allergen Reactions  . Codeine Itching and Rash    Current Outpatient Prescriptions  Medication Sig Dispense Refill  . aspirin 81 MG tablet Take 81 mg by mouth daily. Women's aspirin w/calcium.      Marland Kitchen. atorvastatin (LIPITOR) 20 MG tablet Take 1 tablet (20 mg total) by mouth daily.  90 tablet  3  . fenofibrate 160 MG tablet TAKE 1 TABLET (160 MG TOTAL) BY MOUTH DAILY.  90 tablet  3  . Lactobacillus-Inulin (CULTURELLE DIGESTIVE HEALTH PO) Take by mouth daily.      Marland Kitchen. MELATONIN PO Take by mouth at bedtime.      . metoprolol tartrate (LOPRESSOR) 25 MG tablet Take 12.5 mg by mouth 2 (two) times daily.       . Misc Natural Products (OSTEO BI-FLEX TRIPLE STRENGTH PO) Take by mouth 2 (two) times daily.      . Probiotic Product (PROBIOTIC DAILY PO) Take 10 mg by mouth 2 (two) times daily.      Marland Kitchen. saccharomyces boulardii (FLORASTOR) 250 MG capsule Take 1 capsule (250 mg total) by mouth 2 (two) times daily.  28 capsule  0   Current Facility-Administered Medications  Medication Dose Route Frequency Provider Last Rate Last Dose  . TDaP (BOOSTRIX) injection 0.5 mL  0.5 mL Intramuscular Once Jacques NavyMichael E Norins, MD        Past Medical History  Diagnosis Date  . CORONARY ARTERY DISEASE   . DYSLIPIDEMIA 02/12/2007  . GASTROESOPHAGEAL REFLUX DISEASE 02/12/2007    Past Surgical History  Procedure Laterality Date  . Tubal ligation    . Wisdom tooth extraction    . Rotator cuff repair      Right  . Laparoscopic surgery      Endometriosis   . Coronary artery  bypass graft  Ocrtober '07    2 vessel    ROS:  Hot flashes as weight gain.  Otherwise as stated in the HPI and negative for all other systems.  PHYSICAL EXAM BP 131/78  Pulse 58  Ht 5\' 3"  (1.6 m)  Wt 178 lb (80.74 kg)  BMI 31.54 kg/m2 GENERAL:  Well appearing NECK:  No jugular venous distention, waveform within normal limits, carotid upstroke brisk and symmetric, no bruits, no thyromegaly LYMPHATICS:  No cervical, inguinal adenopathy LUNGS:  Clear to auscultation bilaterally BACK:  No CVA tenderness CHEST:  Well healed sternotomy scar. HEART:  PMI not displaced or sustained,S1 and S2 within normal limits, no S3, no S4, no clicks, no rubs, no murmurs ABD:  Flat, positive bowel sounds normal in frequency in pitch, no bruits, no rebound, no guarding, no midline pulsatile mass, no hepatomegaly, no splenomegaly EXT:  2 plus pulses throughout, no edema, no cyanosis no clubbing   EKG:  Sinus rhythm, rate 58, axis within normal limits, intervals within normal limits, anterior T-wave inversion perhaps slightly pronounced compared with previous, for anterior R wave progression. 01/26/2014   ASSESSMENT AND PLAN  CORONARY ARTERY BYPASS GRAFT, HX OF  She is having no new symptoms since the stress test in 2013.  She can  be very active.  The patient has no new sypmtoms.  No further cardiovascular testing is indicated.  We will continue with aggressive risk reduction and meds as listed.  Of note she does have a chronically abnormal EKG. We will get a this  DYSLIPIDEMIA  She is going to be establishing with Dr. Duaine DredgeBlomgren.  I will defer to follow up labs to him.

## 2014-01-26 NOTE — Patient Instructions (Signed)
Your physician recommends that you schedule a follow-up appointment in: one year with Dr. Hochrein  

## 2014-07-17 ENCOUNTER — Ambulatory Visit
Admission: RE | Admit: 2014-07-17 | Discharge: 2014-07-17 | Disposition: A | Discharge status: Home / Self Care | Payer: No Typology Code available for payment source | Source: Ambulatory Visit | Attending: Family Medicine | Admitting: Family Medicine

## 2014-07-17 ENCOUNTER — Other Ambulatory Visit: Payer: Self-pay | Admitting: Family Medicine

## 2014-07-17 DIAGNOSIS — M5442 Lumbago with sciatica, left side: Secondary | ICD-10-CM

## 2014-12-11 ENCOUNTER — Ambulatory Visit
Admission: RE | Admit: 2014-12-11 | Discharge: 2014-12-11 | Disposition: A | Discharge status: Home / Self Care | Payer: No Typology Code available for payment source | Source: Ambulatory Visit | Attending: Family Medicine | Admitting: Family Medicine

## 2014-12-11 ENCOUNTER — Other Ambulatory Visit: Payer: Self-pay | Admitting: Family Medicine

## 2014-12-11 DIAGNOSIS — M25552 Pain in left hip: Secondary | ICD-10-CM

## 2014-12-13 ENCOUNTER — Other Ambulatory Visit: Payer: Self-pay | Admitting: Family Medicine

## 2014-12-13 DIAGNOSIS — M5416 Radiculopathy, lumbar region: Secondary | ICD-10-CM

## 2014-12-19 ENCOUNTER — Other Ambulatory Visit: Payer: Self-pay | Admitting: Family Medicine

## 2014-12-19 DIAGNOSIS — M25552 Pain in left hip: Secondary | ICD-10-CM

## 2014-12-20 ENCOUNTER — Ambulatory Visit
Admission: RE | Admit: 2014-12-20 | Discharge: 2014-12-20 | Disposition: A | Discharge status: Home / Self Care | Payer: No Typology Code available for payment source | Source: Ambulatory Visit | Attending: Family Medicine | Admitting: Family Medicine

## 2014-12-20 DIAGNOSIS — M25552 Pain in left hip: Secondary | ICD-10-CM

## 2014-12-20 MED ORDER — METHYLPREDNISOLONE ACETATE 40 MG/ML INJ SUSP (RADIOLOG
120.0000 mg | Freq: Once | INTRAMUSCULAR | Status: AC
  Administered 2014-12-20: 120 mg via INTRA_ARTICULAR

## 2014-12-20 MED ORDER — IOHEXOL 180 MG/ML  SOLN
1.0000 mL | Freq: Once | INTRAMUSCULAR | Status: DC | PRN
  Administered 2014-12-20: 1 mL via INTRA_ARTICULAR

## 2014-12-26 ENCOUNTER — Other Ambulatory Visit: Payer: No Typology Code available for payment source

## 2014-12-26 ENCOUNTER — Ambulatory Visit
Admission: RE | Admit: 2014-12-26 | Discharge: 2014-12-26 | Disposition: A | Discharge status: Home / Self Care | Payer: No Typology Code available for payment source | Source: Ambulatory Visit | Attending: Family Medicine | Admitting: Family Medicine

## 2014-12-26 DIAGNOSIS — M5416 Radiculopathy, lumbar region: Secondary | ICD-10-CM

## 2015-03-11 ENCOUNTER — Encounter: Payer: Self-pay | Admitting: Cardiology

## 2015-03-11 ENCOUNTER — Ambulatory Visit (INDEPENDENT_AMBULATORY_CARE_PROVIDER_SITE_OTHER): Payer: No Typology Code available for payment source | Admitting: Cardiology

## 2015-03-11 VITALS — BP 110/80 | HR 73 | Ht 63.5 in | Wt 179.1 lb

## 2015-03-11 DIAGNOSIS — I2510 Atherosclerotic heart disease of native coronary artery without angina pectoris: Secondary | ICD-10-CM

## 2015-03-11 NOTE — Progress Notes (Signed)
HPI The patient presents for followup of known coronary disease. Since I last saw her she has had no cardiovascular problems. She's been continuing to exercise with a trainer. With this she denies any cardiovascular symptoms. The patient denies any new symptoms such as chest discomfort, neck or arm discomfort. There has been no new shortness of breath, PND or orthopnea. There have been no reported palpitations, presyncope or syncope.  Allergies  Allergen Reactions  . Codeine Itching and Rash    Current Outpatient Prescriptions  Medication Sig Dispense Refill  . ALPRAZolam (XANAX) 0.5 MG tablet Take 1 tablet by mouth at bedtime. Take 1 tab daily at bedtime  0  . amitriptyline (ELAVIL) 10 MG tablet Take 1 tablet by mouth at bedtime. Take 1 tab DAILY AT BEDTIME  7  . aspirin 81 MG tablet Take 81 mg by mouth daily. Women's aspirin w/calcium.    Marland Kitchen HYDROcodone-acetaminophen (NORCO/VICODIN) 5-325 MG tablet Take 1 tablet by mouth 2 (two) times daily as needed. Take 1 1 tab daily twice a day  0  . metoprolol tartrate (LOPRESSOR) 25 MG tablet Take 12.5 mg by mouth 2 (two) times daily.     Marland Kitchen omega-3 acid ethyl esters (LOVAZA) 1 G capsule Take 1 capsule by mouth 3 (three) times daily.    . Probiotic Product (PROBIOTIC DAILY PO) Take 10 mg by mouth 2 (two) times daily.    Marland Kitchen atorvastatin (LIPITOR) 20 MG tablet Take 1 tablet (20 mg total) by mouth daily. 90 tablet 3   No current facility-administered medications for this visit.    Past Medical History  Diagnosis Date  . CORONARY ARTERY DISEASE   . DYSLIPIDEMIA 02/12/2007  . GASTROESOPHAGEAL REFLUX DISEASE 02/12/2007    Past Surgical History  Procedure Laterality Date  . Tubal ligation    . Wisdom tooth extraction    . Rotator cuff repair      Right  . Laparoscopic surgery      Endometriosis   . Coronary artery bypass graft  Ocrtober '07    2 vessel    ROS:  Hot flashes, hip pain and insomnia.  Otherwise as stated in the HPI and negative  for all other systems.  PHYSICAL EXAM Pulse 73  Ht 5' 3.5" (1.613 m)  Wt 179 lb 1 oz (81.222 kg)  BMI 31.22 kg/m2 GENERAL:  Well appearing NECK:  No jugular venous distention, waveform within normal limits, carotid upstroke brisk and symmetric, no bruits, no thyromegaly LUNGS:  Clear to auscultation bilaterally BACK:  No CVA tenderness CHEST:  Well healed sternotomy scar. HEART:  PMI not displaced or sustained,S1 and S2 within normal limits, no S3, no S4, no clicks, no rubs, no murmurs ABD:  Flat, positive bowel sounds normal in frequency in pitch, no bruits, no rebound, no guarding, no midline pulsatile mass, no hepatomegaly, no splenomegaly EXT:  2 plus pulses throughout, no edema, no cyanosis no clubbing   EKG:  Sinus rhythm, rate 73, axis within normal limits, intervals within normal limits, anterior T-wave inversion perhaps slightly pronounced compared with previous, for anterior R wave progression. 03/11/2015   ASSESSMENT AND PLAN  CORONARY ARTERY BYPASS GRAFT, HX OF  She is having no new symptoms since the stress test in 2013.  I encouraged her to be more active. When I see her back I will likely check a POET (Plain Old Exercise Treadmill) as it will have been 10 years since CABG   DYSLIPIDEMIA  She is going to be establishing with Dr.  Blomgren.  I will defer to follow up labs to him.

## 2015-03-11 NOTE — Patient Instructions (Signed)
Your physician wants you to follow-up in: 1 Year. You will receive a reminder letter in the mail two months in advance. If you don't receive a letter, please call our office to schedule the follow-up appointment.  

## 2015-03-18 ENCOUNTER — Other Ambulatory Visit: Payer: Self-pay | Admitting: Family Medicine

## 2015-03-18 DIAGNOSIS — R29890 Loss of height: Secondary | ICD-10-CM

## 2015-03-18 DIAGNOSIS — E2839 Other primary ovarian failure: Secondary | ICD-10-CM

## 2015-03-18 DIAGNOSIS — E041 Nontoxic single thyroid nodule: Secondary | ICD-10-CM

## 2015-03-18 DIAGNOSIS — Z1231 Encounter for screening mammogram for malignant neoplasm of breast: Secondary | ICD-10-CM

## 2015-03-19 ENCOUNTER — Other Ambulatory Visit: Payer: Self-pay | Admitting: Family Medicine

## 2015-03-19 DIAGNOSIS — M7062 Trochanteric bursitis, left hip: Secondary | ICD-10-CM

## 2015-03-26 ENCOUNTER — Other Ambulatory Visit: Payer: No Typology Code available for payment source

## 2015-03-28 ENCOUNTER — Ambulatory Visit
Admission: RE | Admit: 2015-03-28 | Discharge: 2015-03-28 | Disposition: A | Discharge status: Home / Self Care | Payer: No Typology Code available for payment source | Source: Ambulatory Visit | Attending: Family Medicine | Admitting: Family Medicine

## 2015-03-28 DIAGNOSIS — M7062 Trochanteric bursitis, left hip: Secondary | ICD-10-CM

## 2015-03-28 MED ORDER — IOHEXOL 180 MG/ML  SOLN
1.0000 mL | Freq: Once | INTRAMUSCULAR | Status: AC | PRN
  Administered 2015-03-28: 1 mL

## 2015-03-28 MED ORDER — METHYLPREDNISOLONE ACETATE 40 MG/ML INJ SUSP (RADIOLOG
120.0000 mg | Freq: Once | INTRAMUSCULAR | Status: AC
  Administered 2015-03-28: 120 mg via INTRAMUSCULAR

## 2015-03-29 ENCOUNTER — Ambulatory Visit
Admission: RE | Admit: 2015-03-29 | Discharge: 2015-03-29 | Disposition: A | Discharge status: Home / Self Care | Payer: No Typology Code available for payment source | Source: Ambulatory Visit | Attending: Family Medicine | Admitting: Family Medicine

## 2015-03-29 DIAGNOSIS — E041 Nontoxic single thyroid nodule: Secondary | ICD-10-CM

## 2015-04-03 ENCOUNTER — Other Ambulatory Visit: Payer: Self-pay | Admitting: Family Medicine

## 2015-04-03 DIAGNOSIS — E041 Nontoxic single thyroid nodule: Secondary | ICD-10-CM

## 2015-04-19 ENCOUNTER — Ambulatory Visit
Admission: RE | Admit: 2015-04-19 | Discharge: 2015-04-19 | Disposition: A | Discharge status: Home / Self Care | Payer: PRIVATE HEALTH INSURANCE | Source: Ambulatory Visit | Attending: Family Medicine | Admitting: Family Medicine

## 2015-04-19 DIAGNOSIS — E2839 Other primary ovarian failure: Secondary | ICD-10-CM

## 2015-04-19 DIAGNOSIS — R29890 Loss of height: Secondary | ICD-10-CM

## 2015-04-19 DIAGNOSIS — Z1231 Encounter for screening mammogram for malignant neoplasm of breast: Secondary | ICD-10-CM

## 2015-04-24 ENCOUNTER — Other Ambulatory Visit (HOSPITAL_COMMUNITY)
Admission: RE | Admit: 2015-04-24 | Discharge: 2015-04-24 | Disposition: A | Discharge status: Home / Self Care | Payer: No Typology Code available for payment source | Source: Ambulatory Visit | Attending: Radiology | Admitting: Radiology

## 2015-04-24 ENCOUNTER — Ambulatory Visit
Admission: RE | Admit: 2015-04-24 | Discharge: 2015-04-24 | Disposition: A | Discharge status: Home / Self Care | Payer: PRIVATE HEALTH INSURANCE | Source: Ambulatory Visit | Attending: Family Medicine | Admitting: Family Medicine

## 2015-04-24 DIAGNOSIS — E041 Nontoxic single thyroid nodule: Secondary | ICD-10-CM | POA: Diagnosis not present

## 2015-05-31 ENCOUNTER — Other Ambulatory Visit: Payer: Self-pay | Admitting: Family Medicine

## 2015-05-31 DIAGNOSIS — M5416 Radiculopathy, lumbar region: Secondary | ICD-10-CM

## 2015-07-24 ENCOUNTER — Ambulatory Visit (HOSPITAL_BASED_OUTPATIENT_CLINIC_OR_DEPARTMENT_OTHER): Payer: PRIVATE HEALTH INSURANCE | Attending: Family Medicine | Admitting: Internal Medicine

## 2015-07-24 VITALS — Ht 64.0 in | Wt 175.0 lb

## 2015-07-24 DIAGNOSIS — G4733 Obstructive sleep apnea (adult) (pediatric): Secondary | ICD-10-CM

## 2015-07-24 DIAGNOSIS — R5383 Other fatigue: Secondary | ICD-10-CM | POA: Diagnosis not present

## 2015-07-24 DIAGNOSIS — R0683 Snoring: Secondary | ICD-10-CM | POA: Insufficient documentation

## 2015-07-24 DIAGNOSIS — G4719 Other hypersomnia: Secondary | ICD-10-CM | POA: Diagnosis not present

## 2015-07-24 DIAGNOSIS — Z79899 Other long term (current) drug therapy: Secondary | ICD-10-CM | POA: Insufficient documentation

## 2015-07-24 DIAGNOSIS — G4730 Sleep apnea, unspecified: Secondary | ICD-10-CM

## 2015-07-31 ENCOUNTER — Ambulatory Visit
Admission: RE | Admit: 2015-07-31 | Discharge: 2015-07-31 | Disposition: A | Discharge status: Home / Self Care | Payer: PRIVATE HEALTH INSURANCE | Source: Ambulatory Visit | Attending: Family Medicine | Admitting: Family Medicine

## 2015-07-31 DIAGNOSIS — M5416 Radiculopathy, lumbar region: Secondary | ICD-10-CM

## 2015-07-31 MED ORDER — METHYLPREDNISOLONE ACETATE 40 MG/ML INJ SUSP (RADIOLOG
120.0000 mg | Freq: Once | INTRAMUSCULAR | Status: AC
  Administered 2015-07-31: 120 mg via EPIDURAL

## 2015-07-31 MED ORDER — IOHEXOL 180 MG/ML  SOLN
1.0000 mL | Freq: Once | INTRAMUSCULAR | Status: AC | PRN
  Administered 2015-07-31: 1 mL via EPIDURAL

## 2015-07-31 NOTE — Discharge Instructions (Signed)

## 2015-08-06 ENCOUNTER — Other Ambulatory Visit: Payer: Self-pay | Admitting: Family Medicine

## 2015-08-06 DIAGNOSIS — M5416 Radiculopathy, lumbar region: Secondary | ICD-10-CM

## 2015-08-07 ENCOUNTER — Other Ambulatory Visit (HOSPITAL_BASED_OUTPATIENT_CLINIC_OR_DEPARTMENT_OTHER): Payer: Self-pay

## 2015-08-07 DIAGNOSIS — G4730 Sleep apnea, unspecified: Secondary | ICD-10-CM

## 2015-08-11 DIAGNOSIS — G4730 Sleep apnea, unspecified: Secondary | ICD-10-CM | POA: Diagnosis not present

## 2015-08-11 DIAGNOSIS — G4733 Obstructive sleep apnea (adult) (pediatric): Secondary | ICD-10-CM | POA: Diagnosis not present

## 2015-08-11 NOTE — Progress Notes (Signed)
  Patient Name: [name] [name], [name] [name] Date: 07/24/2015 Gender: Female D.O.B: 1955/11/03 Age (years): 59 Referring Provider: Mosetta PuttPeter Blomgren Height (inches): 64 Interpreting Physician: Jetty Duhamellinton Young MD, ABSM Weight (lbs): 175 RPSGT: Ulyess MortSpruill, Vicki BMI: 30 MRN: [id number] Neck Size: 13.00 CLINICAL INFORMATION Sleep [name] Type: NPSG Indication for sleep [name]: Excessive Daytime Sleepiness, Fatigue, OSA, Snoring, Witnessed Apneas Epworth Sleepiness Score: 15  SLEEP [name] TECHNIQUE As per the AASM Manual for the Scoring of Sleep and Associated Events v2.3 (April 2016) with a hypopnea requiring 4% desaturations. The channels recorded and monitored were frontal, central and occipital EEG, electrooculogram (EOG), submentalis EMG (chin), nasal and oral airflow, thoracic and abdominal wall motion, anterior tibialis EMG, snore microphone, electrocardiogram, and pulse oximetry.  MEDICATIONS Patient's medications include: charted for review Medications self-administered by patient during sleep [name] : XANAX, AMITRIPTYLINE, GABAPENTIN, ACETAMINOPHEN-HYDROCODONE.  SLEEP ARCHITECTURE The [name] was initiated at 10:55:48 PM and ended at 4:55:07 AM. Sleep onset time was 18.0 minutes and the sleep efficiency was 81.3%. The total sleep time was 292.0 minutes. Stage REM latency was 130.0 minutes. The patient spent 11.82% of the night in stage N1 sleep, 75.00% in stage N2 sleep, 0.00% in stage N3 and 13.18% in REM. Alpha intrusion was absent. Supine sleep was 44.42%.  RESPIRATORY PARAMETERS The overall apnea/hypopnea index (AHI) was 4.9 per hour. There were 2 total apneas, including 1 obstructive, 1 central and 0 mixed apneas. There were 22 hypopneas and 28 RERAs. The AHI during Stage REM sleep was 9.4 per hour. AHI while supine was 9.7 per hour. The mean oxygen saturation was 95.90%. The minimum SpO2 during sleep was 87.00%. Soft snoring was noted during this [name].  CARDIAC DATA The 2 lead EKG  demonstrated sinus rhythm. The mean heart rate was 67.68 beats per minute. Other EKG findings include: None.  LEG MOVEMENT DATA The total PLMS were 0 with a resulting PLMS index of 0.00. Associated arousal with leg movement index was 0.0 .  IMPRESSIONS - No significant obstructive sleep apnea occurred during this [name] (AHI = 4.9/h). - Occasional apnea events are within normal limits. - No significant central sleep apnea occurred during this [name] (CAI = 0.2/h). - Mild oxygen desaturation was noted during this [name] (Min O2 = 87.00%). - The patient snored with Soft snoring volume. - No cardiac abnormalities were noted during this [name]. - Clinically significant periodic limb movements did not occur during sleep. No significant associated arousals.  DIAGNOSIS - Normal [name]  RECOMMENDATIONS - Avoid alcohol, sedatives and other CNS depressants that may worsen sleep apnea and disrupt normal sleep architecture. - Sleep hygiene should be reviewed to assess factors that may improve sleep quality. - Weight management and regular exercise should be initiated or continued if appropriate.  Waymon BudgeYOUNG,CLINTON D Diplomate, American Board of Sleep Medicine  ELECTRONICALLY SIGNED ON:  08/11/2015, 2:42 PM [facility] PH: [phone]   FX: [phone] ACCREDITED BY THE AMERICAN ACADEMY OF SLEEP MEDICINE

## 2015-08-16 ENCOUNTER — Ambulatory Visit
Admission: RE | Admit: 2015-08-16 | Discharge: 2015-08-16 | Disposition: A | Discharge status: Home / Self Care | Payer: PRIVATE HEALTH INSURANCE | Source: Ambulatory Visit | Attending: Family Medicine | Admitting: Family Medicine

## 2015-08-16 DIAGNOSIS — M5416 Radiculopathy, lumbar region: Secondary | ICD-10-CM

## 2015-08-16 MED ORDER — IOPAMIDOL (ISOVUE-M 200) INJECTION 41%
1.0000 mL | Freq: Once | INTRAMUSCULAR | Status: AC
  Administered 2015-08-16: 1 mL via EPIDURAL

## 2015-08-16 MED ORDER — METHYLPREDNISOLONE ACETATE 40 MG/ML INJ SUSP (RADIOLOG
120.0000 mg | Freq: Once | INTRAMUSCULAR | Status: AC
  Administered 2015-08-16: 120 mg via EPIDURAL

## 2015-08-16 NOTE — Discharge Instructions (Signed)

## 2015-10-08 ENCOUNTER — Other Ambulatory Visit: Payer: Self-pay | Admitting: Surgery

## 2015-10-08 DIAGNOSIS — E042 Nontoxic multinodular goiter: Secondary | ICD-10-CM

## 2015-10-22 ENCOUNTER — Ambulatory Visit
Admission: RE | Admit: 2015-10-22 | Discharge: 2015-10-22 | Disposition: A | Discharge status: Home / Self Care | Payer: PRIVATE HEALTH INSURANCE | Source: Ambulatory Visit | Attending: Surgery | Admitting: Surgery

## 2015-10-22 DIAGNOSIS — E042 Nontoxic multinodular goiter: Secondary | ICD-10-CM

## 2016-03-15 IMAGING — US US THYROID BIOPSY
1 series · 13 of 19 positions shown · non-contrast
Comparison: US Soft Tissue Head/Neck

MEDICATIONS:
5 cc 1% lidocaine

COMPLICATIONS:
None immediate

INDICATION: Indeterminate thyroid nodule

Right thyroid nodule 2.2 cm
EXAM:
ULTRASOUND GUIDED FINE NEEDLE ASPIRATION OF INDETERMINATE THYROID
NODULE
TECHNIQUE: Informed written consent was obtained from the patient after a
discussion of the risks, benefits and alternatives to treatment.
Questions regarding the procedure were encouraged and answered. A
timeout was performed prior to the initiation of the procedure.

[Series 1: us thyroid biopsy · 0.06mm/px · 19 acquisitions, 13 frames shown]
[im 1/19]
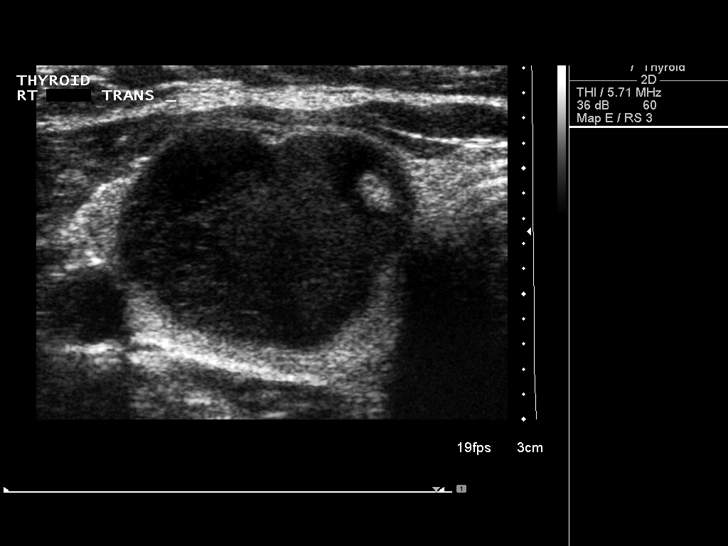
[im 3/19]
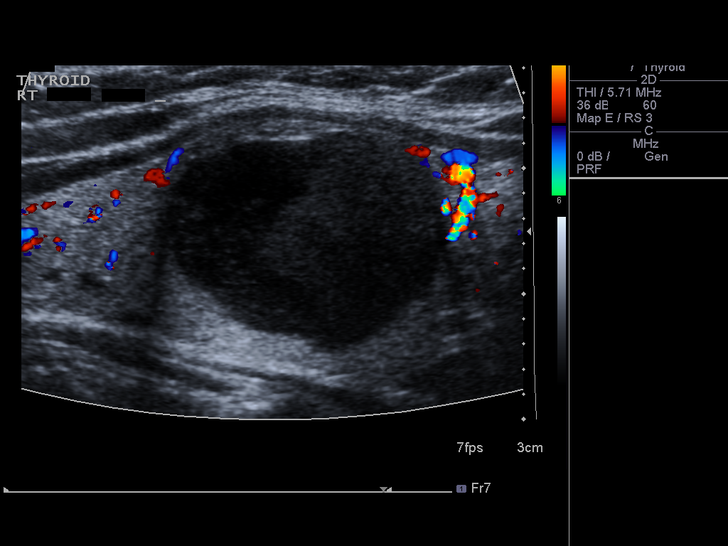
[im 4/19]
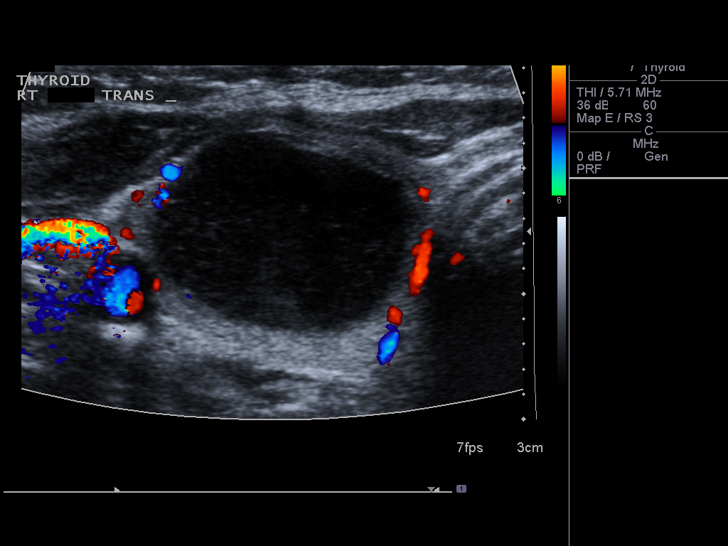
[im 6/19]
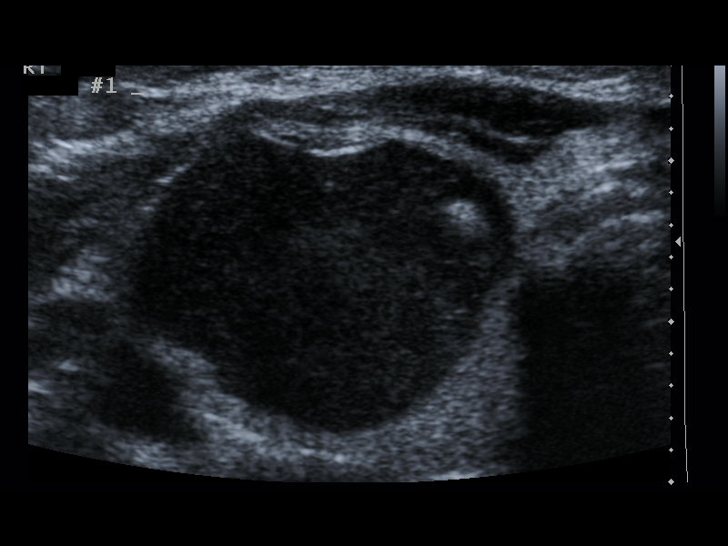
[im 7/19]
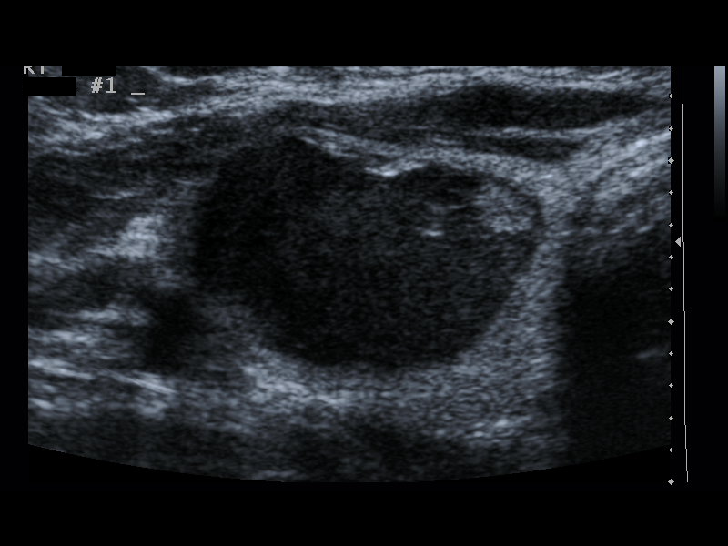
[im 9/19]
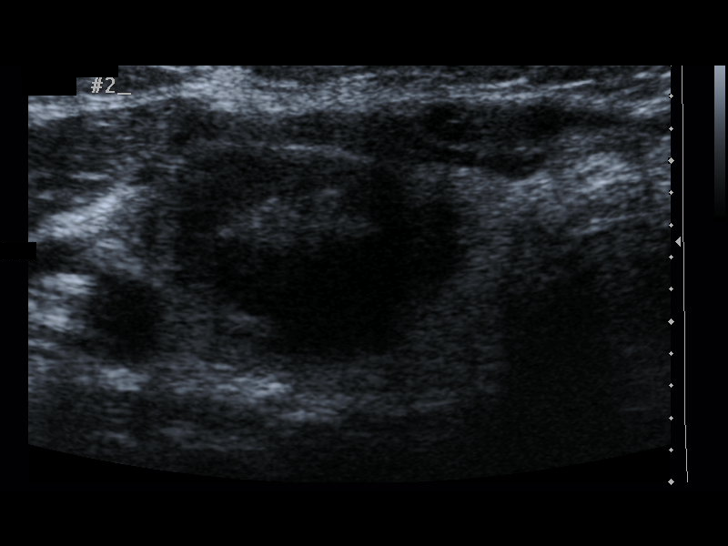
[im 10/19]
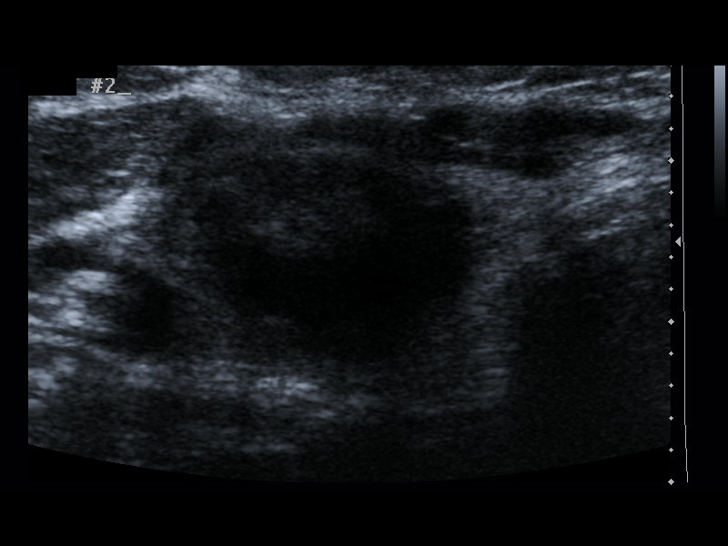
[im 11/19]
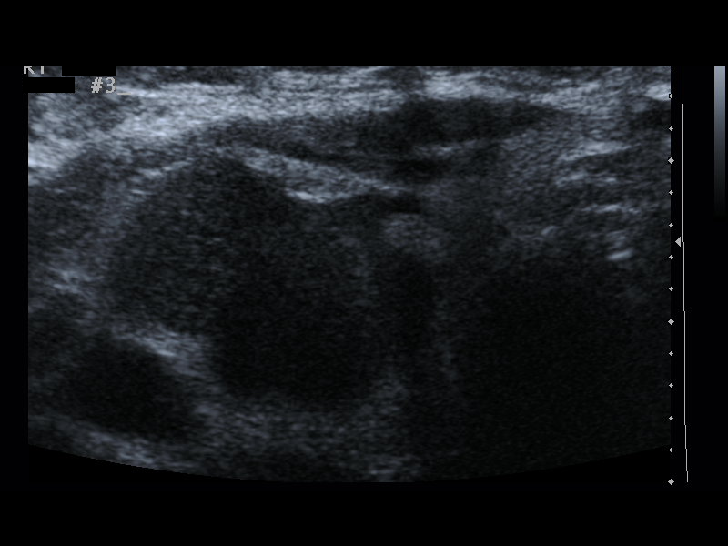
[im 13/19]
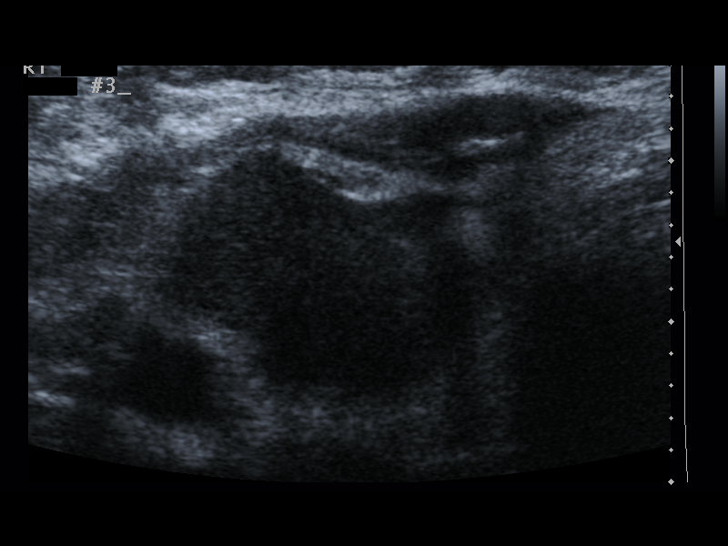
[im 14/19]
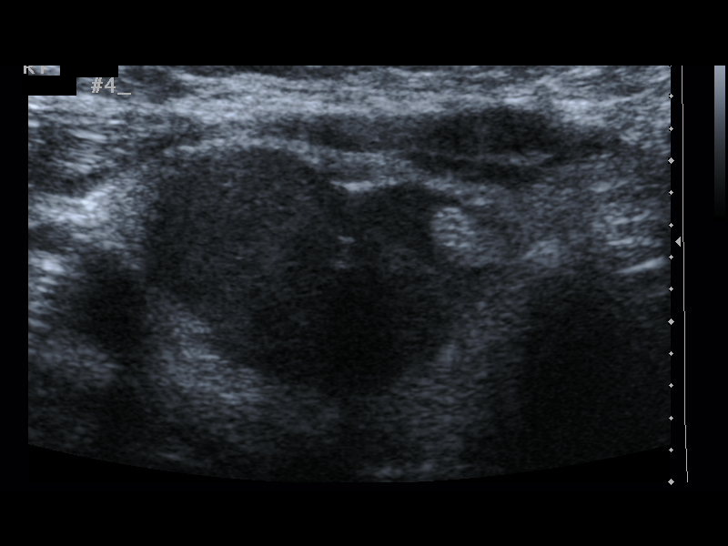
[im 16/19]
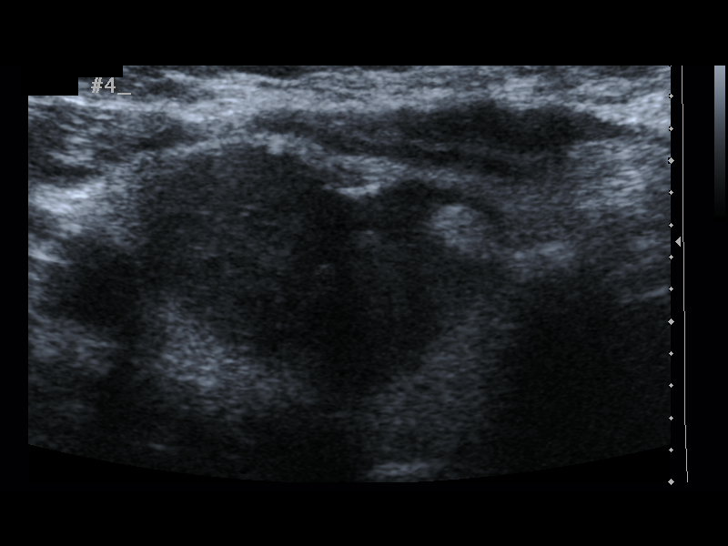
[im 17/19]
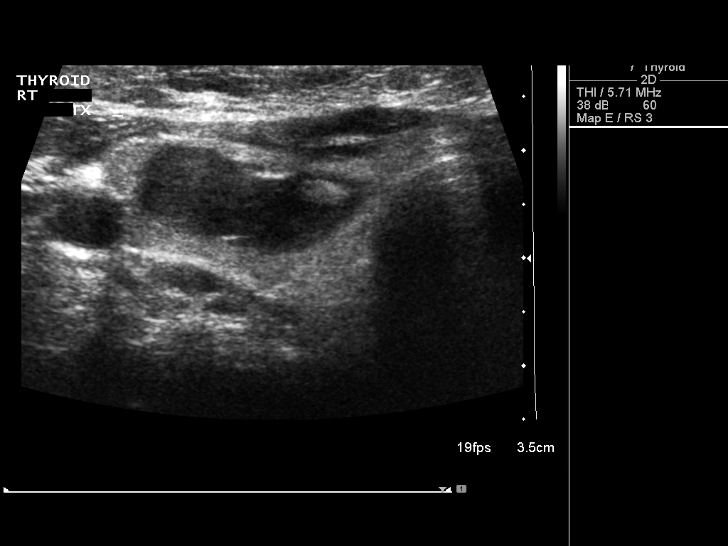
[im 19/19]
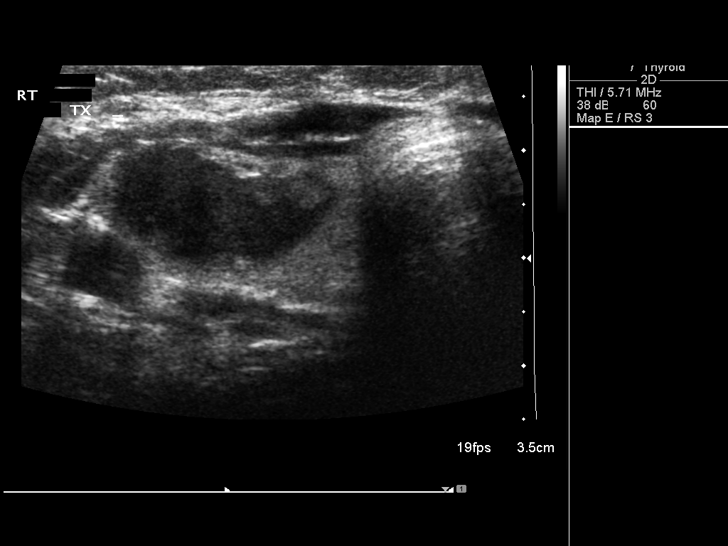

[13 of 19 positions shown; findings below may reference images not displayed]

Pre-procedural ultrasound scanning demonstrated Right thyroid
nodule/ cystic component

The procedure was planned. The neck was prepped in the usual sterile
fashion, and a sterile drape was applied covering the operative
field. A timeout was performed prior to the initiation of the
procedure. Local anesthesia was provided with 1% lidocaine.

Under direct ultrasound guidance, 4 FNA biopsies were performed of
the Rt thyroid nodule with a 25 gauge needle.

2 cc colloid material aspirated during FNA sampling.

The samples were prepared and submitted to pathology.

Limited post procedural scanning was negative for hematoma or
additional complication. Dressings were placed. The patient
tolerated the above procedures procedure well without immediate
postprocedural complication.
IMPRESSION: Technically successful ultrasound guided fine needle aspiration of
Right thyroid nodule

Read by:  Manuel Da Fonseca Sivhilo

## 2016-04-02 ENCOUNTER — Other Ambulatory Visit: Payer: Self-pay | Admitting: Family Medicine

## 2016-04-02 DIAGNOSIS — M5416 Radiculopathy, lumbar region: Secondary | ICD-10-CM

## 2016-04-16 ENCOUNTER — Ambulatory Visit
Admission: RE | Admit: 2016-04-16 | Discharge: 2016-04-16 | Disposition: A | Discharge status: Home / Self Care | Payer: PRIVATE HEALTH INSURANCE | Source: Ambulatory Visit | Attending: Family Medicine | Admitting: Family Medicine

## 2016-04-16 DIAGNOSIS — M5416 Radiculopathy, lumbar region: Secondary | ICD-10-CM

## 2016-04-16 MED ORDER — GADOBENATE DIMEGLUMINE 529 MG/ML IV SOLN
16.0000 mL | Freq: Once | INTRAVENOUS | Status: AC | PRN
  Administered 2016-04-16: 16 mL via INTRAVENOUS

## 2016-05-01 ENCOUNTER — Encounter: Payer: PRIVATE HEALTH INSURANCE | Admitting: Neurology

## 2016-05-28 ENCOUNTER — Ambulatory Visit (INDEPENDENT_AMBULATORY_CARE_PROVIDER_SITE_OTHER): Payer: PRIVATE HEALTH INSURANCE | Admitting: Neurology

## 2016-05-28 ENCOUNTER — Ambulatory Visit (INDEPENDENT_AMBULATORY_CARE_PROVIDER_SITE_OTHER): Payer: Self-pay | Admitting: Neurology

## 2016-05-28 DIAGNOSIS — M79605 Pain in left leg: Secondary | ICD-10-CM | POA: Diagnosis not present

## 2016-05-28 DIAGNOSIS — M79604 Pain in right leg: Secondary | ICD-10-CM

## 2016-05-28 DIAGNOSIS — Z0289 Encounter for other administrative examinations: Secondary | ICD-10-CM

## 2016-05-28 NOTE — Progress Notes (Signed)
Full Name: [name] Gender: Female MRN #: [id number] Date of Birth: Jul 12, 1955    Visit Date: 05/28/2016 12:01 Age: 61 Years 8 Months Old Examining Physician: Naomie DeanAntonia Shakena Callari, MD  Referring Physician: Mosetta PuttPeter Blomgren, MD    Summary  Nerve conduction studies and EMG needle exam were performed on the bilateral lower extremities:  The bilateral Peroneal motor nerves showed normal conductions with normal F Wave latencies The bilateral Tibial motor nerves showed normal conductions with normal F Wave latencies The bilateral Sural sensory nerve conductions were within normal limits The bilateral Superficial Peroneal sensory nerve conductions were within normal limits Bilateral H Reflexes showed normal latencies  EMG Needle study was performed on selected bilateral lower extremity muscles:  The  Iliopsoas, Gluteus Maximus and Medius, Biceps Femoris (long head), Vastus Medialis, Anterior Tibialis, Medial Gastrocnemius, Extensor Hallucis Longus and L5,S1 paraspinals were within normal limits bilaterally.  Conclusion: This is a normal study.  [facility] [address] [place], [place] [address] Tel: [phone]  Fax: [phone]    [facility]    Nerve / Sites Rec. Site Peak Lat Ref. Amp.1-2 Ref. Distance    ms ms V V cm  L Sural - Ankle (Calf)     Calf Ankle 3.85 ?4.40 22.0 ?6.0 14  R Sural - Ankle (Calf)     Calf Ankle 3.39 ?4.40 28.1 ?6.0 14  R Superficial peroneal - Ankle     Lat leg Ankle 4.01 ?4.40 8.4 ?6.0 14  L Superficial peroneal - Ankle     Lat leg Ankle 3.91 ?4.40 9.4 ?6.0 14     [facility]    Nerve / Sites Muscle Latency Ref. Amplitude Ref. Rel Amp Segments Distance Lat Diff Velocity Ref. Area    ms ms mV mV %  cm ms m/s m/s mVms  R Peroneal - EDB     Ankle EDB 5.0 ?6.5 2.1 ?2.0 100 Ankle - EDB 9    8.2     Fib head EDB 11.3  2.1  98.1 Fib head - Ankle 28 6.3 45 ?44 7.7     Pop fossa EDB 13.2  2.0  97.1 Pop fossa - Fib head 9 1.9 47 ?44 7.5         Pop  fossa - Ankle  8.2     L Peroneal - EDB     Ankle EDB 6.2 ?6.5 2.3 ?2.0 100 Ankle - EDB 9    6.3     Fib head EDB 11.7  2.2  92.4 Fib head - Ankle 28 5.5 51 ?44 7.4     Pop fossa EDB 13.4  2.3  105 Pop fossa - Fib head 9 1.7 52 ?44 7.6         Pop fossa - Ankle  7.2     R Tibial - AH     Ankle AH 4.6 ?5.8 13.5 ?4.0 100 Ankle - AH 9    39.1     Pop fossa AH 13.3  7.8  57.4 Pop fossa - Ankle 38 8.7 44 ?41 27.0  L Tibial - AH     Ankle AH 4.9 ?5.8 11.6 ?4.0 100 Ankle - AH 9    33.7     Pop fossa AH 13.5  6.1  52.8 Pop fossa - Ankle 38 8.5 44 ?41 21.8     F  Wave    Nerve F Lat Ref.   ms ms  R Tibial - AH 43.7 ?56.0  L Tibial - AH  44.4 ?56.0     EMG full

## 2016-05-29 NOTE — Procedures (Signed)
Full Name: Vallarie MareRisa Spalding Gender: Female MRN #: [id number] Date of Birth: Jul 12, 1955    Visit Date: 05/28/2016 12:01 Age: 61 Years 8 Months Old Examining Physician: Naomie DeanAntonia Sarie Stall, MD  Referring Physician: Mosetta PuttPeter Blomgren, MD    Summary  Nerve conduction studies and EMG needle exam were performed on the bilateral lower extremities:  The bilateral Peroneal motor nerves showed normal conductions with normal F Wave latencies The bilateral Tibial motor nerves showed normal conductions with normal F Wave latencies The bilateral Sural sensory nerve conductions were within normal limits The bilateral Superficial Peroneal sensory nerve conductions were within normal limits Bilateral H Reflexes showed normal latencies  EMG Needle study was performed on selected bilateral lower extremity muscles:  The  Iliopsoas, Gluteus Maximus and Medius, Biceps Femoris (long head), Vastus Medialis, Anterior Tibialis, Medial Gastrocnemius, Extensor Hallucis Longus and L5,S1 paraspinals were within normal limits bilaterally.  Conclusion: This is a normal study.  [facility] [address] [place], [place] [address] Tel: [phone]  Fax: [phone]    [facility]    Nerve / Sites Rec. Site Peak Lat Ref. Amp.1-2 Ref. Distance    ms ms V V cm  L Sural - Ankle (Calf)     Calf Ankle 3.85 ?4.40 22.0 ?6.0 14  R Sural - Ankle (Calf)     Calf Ankle 3.39 ?4.40 28.1 ?6.0 14  R Superficial peroneal - Ankle     Lat leg Ankle 4.01 ?4.40 8.4 ?6.0 14  L Superficial peroneal - Ankle     Lat leg Ankle 3.91 ?4.40 9.4 ?6.0 14     [facility]    Nerve / Sites Muscle Latency Ref. Amplitude Ref. Rel Amp Segments Distance Lat Diff Velocity Ref. Area    ms ms mV mV %  cm ms m/s m/s mVms  R Peroneal - EDB     Ankle EDB 5.0 ?6.5 2.1 ?2.0 100 Ankle - EDB 9    8.2     Fib head EDB 11.3  2.1  98.1 Fib head - Ankle 28 6.3 45 ?44 7.7     Pop fossa EDB 13.2  2.0  97.1 Pop fossa - Fib head 9 1.9 47 ?44 7.5         Pop  fossa - Ankle  8.2     L Peroneal - EDB     Ankle EDB 6.2 ?6.5 2.3 ?2.0 100 Ankle - EDB 9    6.3     Fib head EDB 11.7  2.2  92.4 Fib head - Ankle 28 5.5 51 ?44 7.4     Pop fossa EDB 13.4  2.3  105 Pop fossa - Fib head 9 1.7 52 ?44 7.6         Pop fossa - Ankle  7.2     R Tibial - AH     Ankle AH 4.6 ?5.8 13.5 ?4.0 100 Ankle - AH 9    39.1     Pop fossa AH 13.3  7.8  57.4 Pop fossa - Ankle 38 8.7 44 ?41 27.0  L Tibial - AH     Ankle AH 4.9 ?5.8 11.6 ?4.0 100 Ankle - AH 9    33.7     Pop fossa AH 13.5  6.1  52.8 Pop fossa - Ankle 38 8.5 44 ?41 21.8     F  Wave    Nerve F Lat Ref.   ms ms  R Tibial - AH 43.7 ?56.0  L Tibial - AH  44.4 ?56.0     EMG full

## 2016-05-29 NOTE — Progress Notes (Signed)
See procedure note.

## 2016-06-09 ENCOUNTER — Encounter: Payer: Self-pay | Admitting: Neurology

## 2016-06-09 ENCOUNTER — Ambulatory Visit (INDEPENDENT_AMBULATORY_CARE_PROVIDER_SITE_OTHER): Payer: PRIVATE HEALTH INSURANCE | Admitting: Neurology

## 2016-06-09 VITALS — Ht 64.0 in | Wt 184.0 lb

## 2016-06-09 DIAGNOSIS — G8929 Other chronic pain: Secondary | ICD-10-CM

## 2016-06-09 DIAGNOSIS — M5440 Lumbago with sciatica, unspecified side: Secondary | ICD-10-CM | POA: Diagnosis not present

## 2016-06-09 DIAGNOSIS — M5410 Radiculopathy, site unspecified: Secondary | ICD-10-CM | POA: Diagnosis not present

## 2016-06-09 NOTE — Patient Instructions (Signed)
Remember to drink plenty of fluid, eat healthy meals and do not skip any meals. Try to eat protein with a every meal and eat a healthy snack such as fruit or nuts in between meals. Try to keep a regular sleep-wake schedule and try to exercise daily, particularly in the form of walking, 20-30 minutes a day, if you can.   As far as diagnostic testing: Xrays of the lumbar spine flexion/extension  I would like to see you back as needed, sooner if we need to. Please call us with any interim questions, concerns, problems, updates or refill requests.   Our phone number is [phone]. We also have an after hours call service for urgent matters and there is a physician on-call for urgent questions. For any emergencies you know to call 911 or go to the nearest emergency room

## 2016-06-09 NOTE — Progress Notes (Signed)
[facility]    Provider:  Dr Lucia Gaskins Referring Provider: Mosetta Putt, MD Primary Care Physician:  Carolyne Fiscal, MD  CC:  Leg pain  HPI:  [name] is a 61 y.o. female here as a referral from Dr. Duaine Dredge for Leg pain. Leg pain started several years ago after falling down a flight of stairs. Had a huge bruise on the low back. Her legs were also sore. She stayed in bed a few days. As the legs went by she has pain in the lower back, radiates down both legs. She has seen a chiropractor for sciatica, she has seen a physical therapist and she is doing dry needling. She thinks a lot of her problem is that she is 40 lbs overweight and she is trying to lose weight. The pain starts in the low back and runs down the back of the leg but sometimes it runs down the front of the legs. It can alternate or be both legs. It feels like a toothache. Moving around helps, she turns often in bed. In bed is the worst. Sitting for long periods of time makes it worse. No other focal neurologic deficits, associated symptoms, inciting events or modifiable factors.  Personally reviewed imaging and agree with the following:  L1-2: Shallow disc bulge to the right without central canal or foraminal narrowing, unchanged.  L2-3: Disc bulge and endplate spur eccentric to the left are unchanged. Mild facet degenerative disease is also again seen. Mild left foraminal narrowing is unchanged. The central canal and right foramen are widely patent.  L3-4: Minimal disc bulge and moderate facet arthropathy, unchanged. The central canal and foramina are widely patent.  L4-5: Shallow disc bulge and moderate facet degenerative disease are unchanged. The central canal and foramina are widely patent.  L5-S1: Severe right and moderate left facet degenerative disease, unchanged. No disc bulge or protrusion. The central canal and foramina are widely patent.  IMPRESSION: No change in the  appearance of the lumbar spine since the prior exam. No new abnormality.  Spondylosis worst at L2-3 where there is a disc bulge with endplate spur eccentric to the left causing mild left foraminal stenosis. The central canal and right foramen are open.  Multilevel facet arthropathy appears worst on the right at L5-S1.  Convex right scoliosis.  ldl 53  Review of Systems: Patient complains of symptoms per HPI as well as the following symptoms: snoring, weight gain, snorig, feeling hot, flushing, joint pain. Pertinent negatives per HPI. All others negative.   Social History   Social History  . Marital status: Married    Spouse name: N/A  . Number of children: 0  . Years of education: BA   Occupational History  . Retired    Social History Main Topics  . Smoking status: Never Smoker  . Smokeless tobacco: Never Used  . Alcohol use Yes     Comment: rare - once a month  . Drug use: No  . Sexual activity: Yes    Partners: Male   Other Topics Concern  . 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 Married 2002. No children. Worked at [employer], married the boss. She is now retired. Marriage is in good health.    Right-handed   Caffeine: 2-3 cups of coffee per day    Family History  Problem Relation Age of Onset  . Heart disease Mother   . Hyperlipidemia Mother   . Hypertension Mother   .  Stroke Mother   . Depression Mother   . Atrial fibrillation Mother   . Pancreatitis Father   . Prostate cancer Father   . Cancer Brother     rectal/colon and lung cancer  . Colon cancer Brother 51    deceased from colon cancer  . Colon cancer Paternal Grandfather [id number]  . Testicular cancer Brother   . Diabetes Maternal Aunt   . Cancer Maternal Aunt     breast - fatal  . Diabetes Maternal Grandmother   . Esophageal cancer Neg Hx   . Rectal cancer Neg Hx   . Stomach cancer Neg Hx     Past Medical History:  Diagnosis Date  . Anxiety     . CORONARY ARTERY DISEASE   . DYSLIPIDEMIA 02/12/2007  . GASTROESOPHAGEAL REFLUX DISEASE 02/12/2007    Past Surgical History:  Procedure Laterality Date  . CORONARY ARTERY BYPASS GRAFT  Rana Snarecrtober '07   2 vessel  . Laparoscopic surgery     Endometriosis   . ROTATOR CUFF REPAIR Right 01/2005  . TUBAL LIGATION  06/2001  . WISDOM TOOTH EXTRACTION      Current Outpatient Prescriptions  Medication Sig Dispense Refill  . ALPRAZolam (XANAX) 0.5 MG tablet Take 1 tablet by mouth at bedtime. Take 1 tab daily at bedtime  0  . amitriptyline (ELAVIL) 10 MG tablet Take 1 tablet by mouth at bedtime. Take 1 tab DAILY AT BEDTIME  7  . aspirin 81 MG tablet Take 81 mg by mouth daily. Women's aspirin w/calcium.    Marland Kitchen. atorvastatin (LIPITOR) 40 MG tablet TABLET 1/2 TABLET BY MOUTH EVERY EVENING AFTER MEALS  3  . calcium carbonate (OS-CAL) 600 MG tablet Take 600 mg by mouth 2 (two) times daily.    . Cholecalciferol (VITAMIN D) 2000 units CAPS Take 1 capsule by mouth daily.    Marland Kitchen. HYDROcodone-acetaminophen (NORCO/VICODIN) 5-325 MG tablet Take 1 tablet by mouth 2 (two) times daily as needed. Take 1 1 tab daily twice a day  0  . LYRICA 75 MG capsule TAKE ONE CAPSULE BY MOUTH EVERY MORNING AND 2 AT BEDTIME  3  . metoprolol tartrate (LOPRESSOR) 25 MG tablet Take 12.5 mg by mouth 2 (two) times daily.     Marland Kitchen. omega-3 acid ethyl esters (LOVAZA) 1 G capsule Take 1 capsule by mouth 3 (three) times daily.    . Probiotic Product (PROBIOTIC DAILY PO) Take 10 mg by mouth 2 (two) times daily.    . rosuvastatin (CRESTOR) 20 MG tablet TAKE 1 TABLET BY MOUTH EVERY EVENING AFTER MEALS  0   No current facility-administered medications for this visit.     Allergies as of 06/09/2016 - Review Complete 06/09/2016  Allergen Reaction Noted  . Codeine Itching and Rash     Vitals: Ht 5\' 4"  (1.626 m)   Wt 184 lb (83.5 kg)   BMI 31.58 kg/m  Last Weight:  Wt Readings from Last 1 Encounters:  06/09/16 184 lb (83.5 kg)   Last  Height:   Ht Readings from Last 1 Encounters:  06/09/16 5\' 4"  (1.626 m)    Physical exam: Exam: Gen: NAD, conversant, well nourised, obese, well groomed                     CV: RRR, no MRG. No Carotid Bruits. No peripheral edema, warm, nontender Eyes: Conjunctivae clear without exudates or hemorrhage  Neuro: Detailed Neurologic Exam  Speech:    Speech is normal; fluent and spontaneous with normal  comprehension.  Cognition:    The patient is oriented to person, place, and time;     recent and remote memory intact;     language fluent;     normal attention, concentration,     fund of knowledge Cranial Nerves:    The pupils are equal, round, and reactive to light. The fundi are normal and spontaneous venous pulsations are present. Visual fields are full to finger confrontation. Extraocular movements are intact. Trigeminal sensation is intact and the muscles of mastication are normal. The face is symmetric. The palate elevates in the midline. Hearing intact. Voice is normal. Shoulder shrug is normal. The tongue has normal motion without fasciculations.   Coordination:    Normal finger to nose and heel to shin. Normal rapid alternating movements.   Gait:    Heel-toe and tandem gait are normal.   Motor Observation:    No asymmetry, no atrophy, and no involuntary movements noted. Tone:    Normal muscle tone.    Posture:    Posture is normal. normal erect    Strength:    Strength is V/V in the upper and lower limbs.      Sensation: intact to LT     Reflex Exam:  DTR's:    Deep tendon reflexes in the upper and lower extremities are normal bilaterally.   Toes:    The toes are downgoing bilaterally.   Clonus:    Clonus is absent.      Assessment/Plan:  61 year old with back pain and radicular symptoms. EMG/NCS was normal. MRI lumbar spine did not show etiology. Exam normal.   Recommend continues PT and weight loss Flexion/extension xrays Return if symptoms persist  after weight loss and exercise  Cc: Dr. Lavonia Dana, MD  [facility] [address] [place], [place] [address]  Phone [phone] Fax [phone]  A total of 30 minutes was spent face-to-face with this patient. Over half this time was spent on counseling patient on the chronic LBP diagnosis and different diagnostic and therapeutic options available.

## 2016-06-19 ENCOUNTER — Ambulatory Visit
Admission: RE | Admit: 2016-06-19 | Discharge: 2016-06-19 | Disposition: A | Discharge status: Home / Self Care | Payer: PRIVATE HEALTH INSURANCE | Source: Ambulatory Visit | Attending: Neurology | Admitting: Neurology

## 2016-06-19 DIAGNOSIS — G8929 Other chronic pain: Secondary | ICD-10-CM

## 2016-06-19 DIAGNOSIS — M5410 Radiculopathy, site unspecified: Secondary | ICD-10-CM

## 2016-06-19 DIAGNOSIS — M5440 Lumbago with sciatica, unspecified side: Secondary | ICD-10-CM

## 2017-05-10 ENCOUNTER — Ambulatory Visit: Payer: PRIVATE HEALTH INSURANCE | Admitting: Cardiology

## 2017-05-11 IMAGING — CR DG LUMBAR SPINE BEND(FLEX/EXT) ONLY 2-3 V
3 series · 3 of 3 positions shown · non-contrast
Comparison: Lumbar spine MRI 04/16/2016

CLINICAL DATA: Bilateral low back pain

EXAM:
LUMBAR SPINE FLEX AND EXTEND ONLY - 2-3 VIEW

[w lumbar spine lat (1 of 2)]
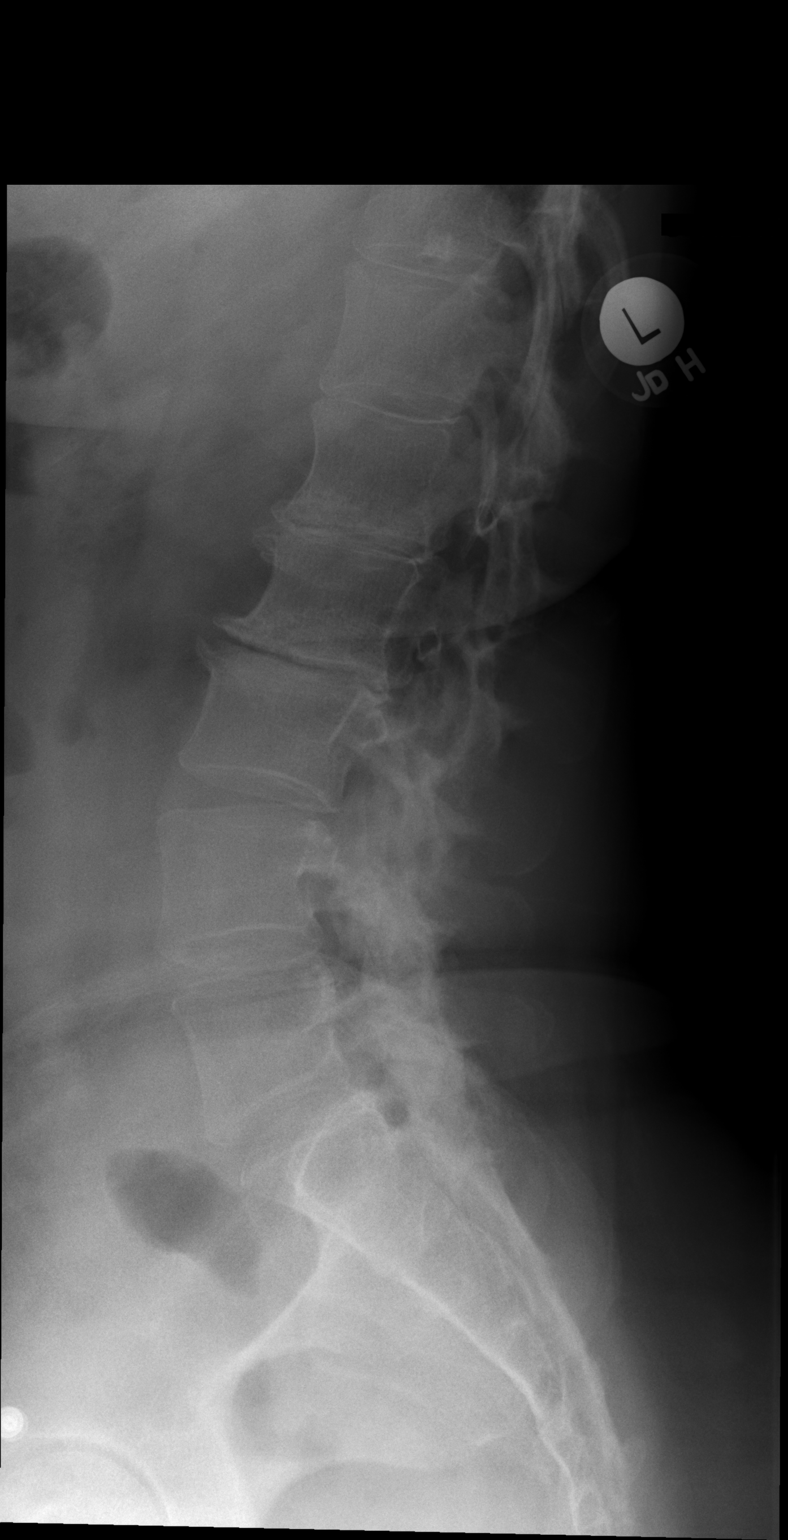

[w lumbar spine lat (2 of 2)]
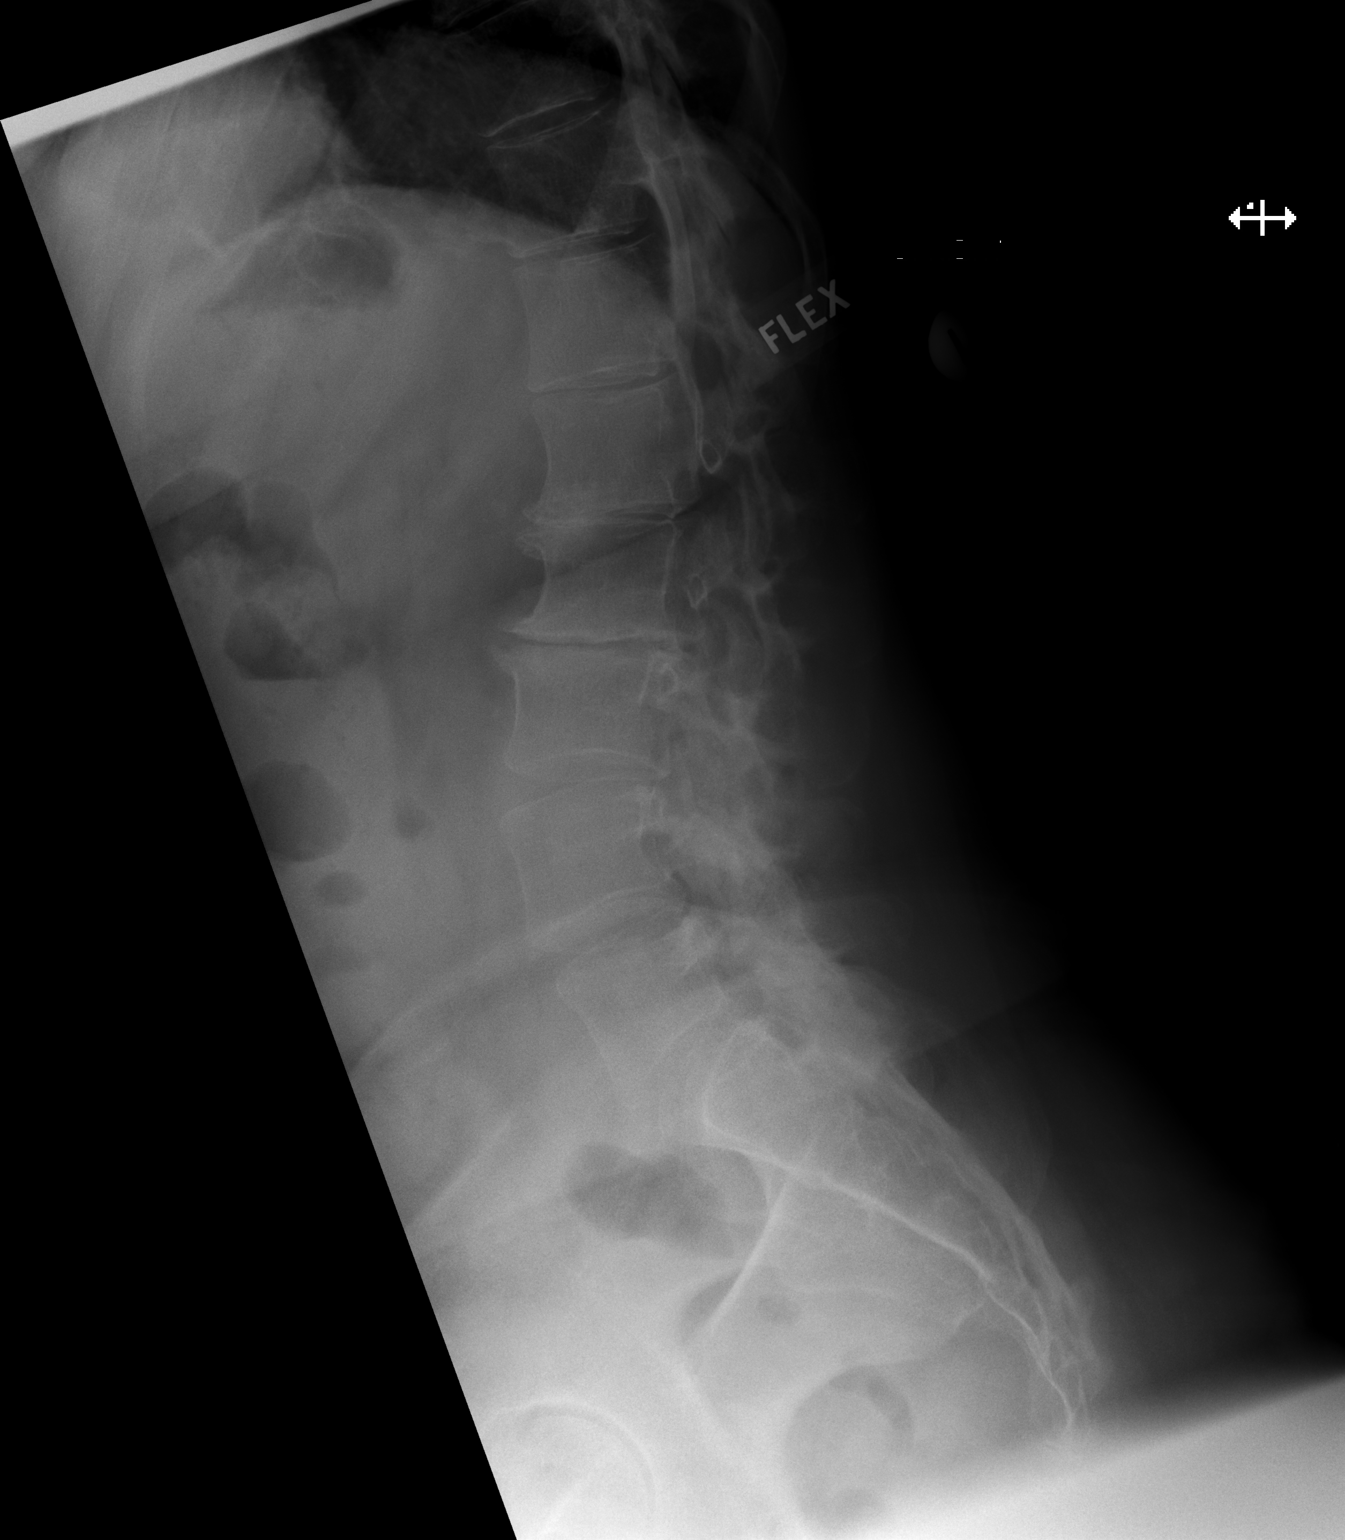

[w lumbar spine extension]
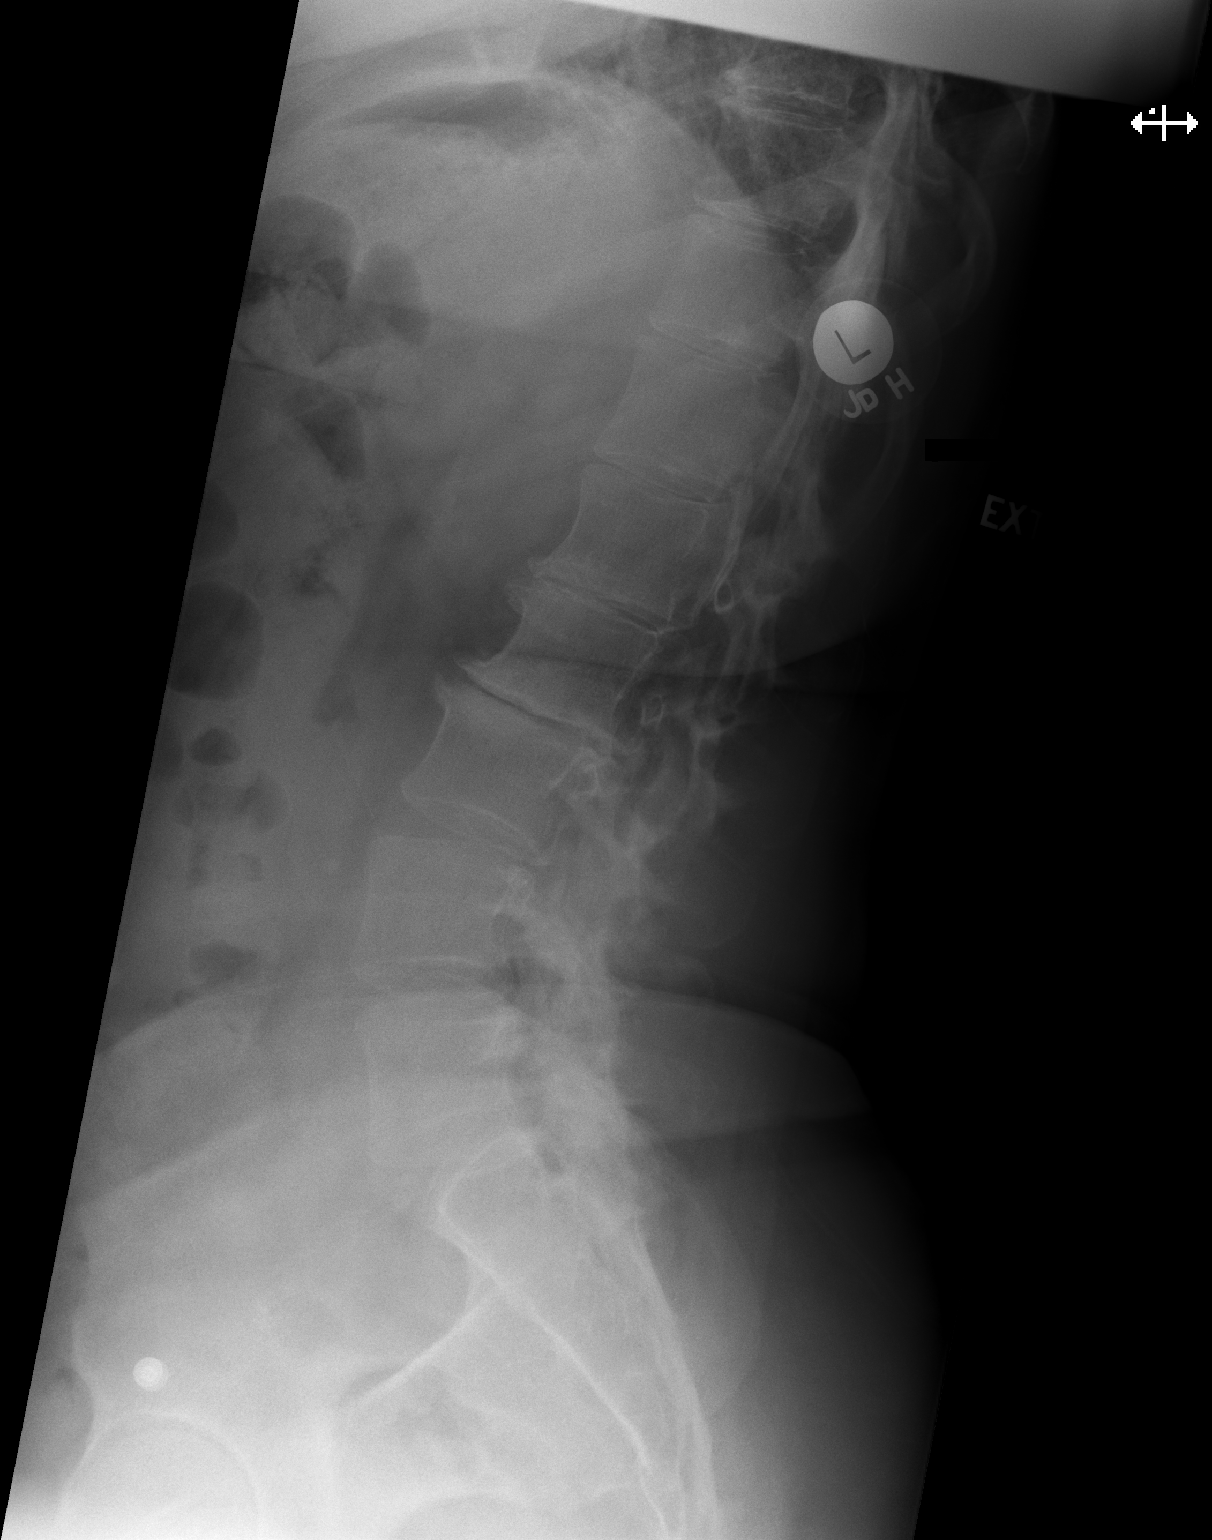

[3 of 3 positions shown; findings below may reference images not displayed]

FINDINGS: Three views of the lumbar spine submitted including
flexion-extension views. Again noted significant disc space
flattening with endplate sclerotic changes and mild anterior
spurring L1-L2 and L2-L3 level. There is minimal disc space
flattening at L3-L4 level. Facet degenerative changes are noted
L3-L4 and L5 level. Mild disc space flattening at L5-S1 level.
Flexion-extension views shows no evidence of lumbar instability.
IMPRESSION: No lumbar spine acute fracture or subluxation. Again noted
degenerative changes most significant at L1-L2 and L2-L3 level. No
evidence of lumbar instability on flexion-extension views.

## 2017-05-15 NOTE — Progress Notes (Signed)
Cardiology Office Note   Date:  05/19/2017   ID:  [name], [name] 25-Aug-1955, MRN [id number]  PCP:  Mosetta Putt, MD  Cardiologist:   No primary care provider on file.   Chief Complaint  Patient presents with  . Coronary Artery Disease      History of Present Illness: [name] is a 62 y.o. female who presents for follow up of CAD/CABG.   Since I last saw her she has had a lot of stress and that [place] 62 year [place]  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  With this she denied any cardiovascular symptoms. The patient denies any new symptoms such as chest discomfort, neck or arm discomfort. There has been no new shortness of breath, PND or orthopnea. There have been no reported palpitations, presyncope or syncope.  He has gained weight and she reports that her lipids are not well controlled.   Past Medical History:  Diagnosis Date  . Anxiety   . CORONARY ARTERY DISEASE   . DYSLIPIDEMIA 02/12/2007  . GASTROESOPHAGEAL REFLUX DISEASE 02/12/2007    Past Surgical History:  Procedure Laterality Date  . CORONARY ARTERY BYPASS GRAFT  Rana Snare '07   2 vessel  . Laparoscopic surgery     Endometriosis   . ROTATOR CUFF REPAIR Right 01/2005  . TUBAL LIGATION  06/2001  . WISDOM TOOTH EXTRACTION       Current Outpatient Medications  Medication Sig Dispense Refill  . ALPRAZolam (XANAX) 0.5 MG tablet Take 1 tablet by mouth at bedtime. Take 1 tab daily at bedtime  0  . amitriptyline (ELAVIL) 10 MG tablet Take 1 tablet by mouth at bedtime. Take 1 tab DAILY AT BEDTIME  7  . aspirin 81 MG tablet Take 81 mg by mouth daily. Women's aspirin w/calcium.    . Calcium Citrate 200 MG TABS Take 4 tablets by mouth daily.    . Cholecalciferol (VITAMIN D) 2000 units CAPS Take 1 capsule by mouth daily.    Marland Kitchen ezetimibe (ZETIA) 10 MG  tablet Take 10 mg by mouth daily.    Marland Kitchen HYDROcodone-acetaminophen (NORCO/VICODIN) 5-325 MG tablet Take 1 tablet by mouth 2 (two) times daily as needed. Take 1 1 tab daily twice a day  0  . metoprolol tartrate (LOPRESSOR) 25 MG tablet Take 12.5 mg by mouth 2 (two) times daily.     . Probiotic Product (PROBIOTIC DAILY PO) Take 10 mg by mouth 2 (two) times daily.    . rosuvastatin (CRESTOR) 40 MG tablet Take 1 tablet by mouth daily.  3  . calcium carbonate (OS-CAL) 600 MG tablet Take 600 mg by mouth 2 (two) times daily.    Marland Kitchen LYRICA 75 MG capsule TAKE ONE CAPSULE BY MOUTH EVERY MORNING AND 2 AT BEDTIME  3  . omega-3 acid ethyl esters (LOVAZA) 1 G capsule Take 1 capsule by mouth 3 (three) times daily.     No current facility-administered medications for this visit.     Allergies:   Codeine    ROS:  Please see the history of present illness.   Otherwise, review of systems are positive for none.   All other systems are reviewed and negative.    PHYSICAL EXAM: VS:  BP 122/78   Pulse 60   Ht 5\' 3"  (1.6 m)  Wt 185 lb (83.9 kg)   BMI 32.77 kg/m  , BMI Body mass index is 32.77 kg/m. GENERAL:  Well appearing HEENT:  Pupils equal round and reactive, fundi not visualized, oral mucosa unremarkable NECK:  No jugular venous distention, waveform within normal limits, carotid upstroke brisk and symmetric, no bruits, no thyromegaly LYMPHATICS:  No cervical, inguinal adenopathy LUNGS:  Clear to auscultation bilaterally BACK:  No CVA tenderness CHEST:  Unremarkable HEART:  PMI not displaced or sustained,S1 and S2 within normal limits, no S3, no S4, no clicks, no rubs, no murmurs ABD:  Flat, positive bowel sounds normal in frequency in pitch, no bruits, no rebound, no guarding, no midline pulsatile mass, no hepatomegaly, no splenomegaly EXT:  2 plus pulses throughout, trace edema, no cyanosis no clubbing   EKG:  EKG is ordered today. The ekg ordered today demonstrates sinus rhythm, rate 59, axis  within normal limits, intervals within normal limits, no acute ST-T wave changes.   Recent Labs: No results found for requested labs within last 8760 hours.    Lipid Panel    Component Value Date/Time   CHOL 131 01/17/2013 0815   TRIG 120.0 01/17/2013 0815   HDL 52.90 01/17/2013 0815   CHOLHDL 2 01/17/2013 0815   VLDL 24.0 01/17/2013 0815   LDLCALC 54 01/17/2013 0815   LDLDIRECT 53.3 03/15/2012 0819      Wt Readings from Last 3 Encounters:  05/17/17 185 lb (83.9 kg)  06/09/16 184 lb (83.5 kg)  07/24/15 175 lb (79.4 kg)      Other studies Reviewed: Additional studies/ records that were reviewed today include: Labs. Review of the above records demonstrates:  Please see elsewhere in the note.     ASSESSMENT AND PLAN:   CORONARY ARTERY BYPASS GRAFT, HX OF :     She is having no new symptoms since the stress test in 2013.  At this point I will continue secondary risk reduction.  I will likely perform stress test routinely next year.  DYSLIPIDEMIA:    I will will obtain her outside labs and review to see if she is at target.    Current medicines are reviewed at length with the patient today.  The patient does not have concerns regarding medicines.  The following changes have been made:  no change  Labs/ tests ordered today include: None  Orders Placed This Encounter  Procedures  . EKG 12-Lead     Disposition:   FU with me in one year.     Signed, Rollene RotundaJames Railee Bonillas, MD  05/19/2017 8:50 PM    [facility]

## 2017-05-17 ENCOUNTER — Ambulatory Visit (INDEPENDENT_AMBULATORY_CARE_PROVIDER_SITE_OTHER): Payer: PRIVATE HEALTH INSURANCE | Admitting: Cardiology

## 2017-05-17 ENCOUNTER — Encounter: Payer: Self-pay | Admitting: Cardiology

## 2017-05-17 ENCOUNTER — Encounter (INDEPENDENT_AMBULATORY_CARE_PROVIDER_SITE_OTHER): Payer: Self-pay

## 2017-05-17 VITALS — BP 122/78 | HR 60 | Ht 63.0 in | Wt 185.0 lb

## 2017-05-17 DIAGNOSIS — E785 Hyperlipidemia, unspecified: Secondary | ICD-10-CM | POA: Diagnosis not present

## 2017-05-17 DIAGNOSIS — I2510 Atherosclerotic heart disease of native coronary artery without angina pectoris: Secondary | ICD-10-CM

## 2017-05-17 NOTE — Patient Instructions (Signed)
Medication Instructions:  Continue current medications  If you need a refill on your cardiac medications before your next appointment, please call your pharmacy.  Labwork: None Ordered   Testing/Procedures: None Ordered  Follow-Up: Your physician wants you to follow-up in: 1 Year. You should receive a reminder letter in the mail two months in advance. If you do not receive a letter, please call our office [phone].    Thank you for choosing [facility]!!      

## 2017-05-19 ENCOUNTER — Encounter: Payer: Self-pay | Admitting: Cardiology

## 2018-01-25 ENCOUNTER — Other Ambulatory Visit: Payer: Self-pay | Admitting: Family Medicine

## 2018-01-25 DIAGNOSIS — Z8639 Personal history of other endocrine, nutritional and metabolic disease: Secondary | ICD-10-CM

## 2018-01-31 ENCOUNTER — Ambulatory Visit
Admission: RE | Admit: 2018-01-31 | Discharge: 2018-01-31 | Disposition: A | Discharge status: Home / Self Care | Payer: PRIVATE HEALTH INSURANCE | Source: Ambulatory Visit | Attending: Family Medicine | Admitting: Family Medicine

## 2018-01-31 DIAGNOSIS — Z8639 Personal history of other endocrine, nutritional and metabolic disease: Secondary | ICD-10-CM

## 2018-04-17 ENCOUNTER — Encounter: Payer: Self-pay | Admitting: Gastroenterology

## 2018-08-01 ENCOUNTER — Telehealth: Payer: Self-pay | Admitting: Cardiology

## 2018-08-01 NOTE — Telephone Encounter (Signed)
lmtcb - 1 year recall - virtual now or ov in august.

## 2018-08-08 NOTE — Telephone Encounter (Signed)
LMOM @ 11:01 am, GH:[id number].

## 2018-08-19 ENCOUNTER — Telehealth: Payer: Self-pay | Admitting: Cardiology

## 2018-08-19 NOTE — Telephone Encounter (Signed)
LVM to schedule recall. °

## 2019-09-20 ENCOUNTER — Telehealth: Payer: Self-pay | Admitting: *Deleted

## 2019-09-20 NOTE — Telephone Encounter (Signed)
   [facility] Pre-operative Risk Assessment    HEARTCARE STAFF: - Please ensure there is not already an duplicate clearance open for this procedure. - Under Visit Info/Reason for Call, type in Other and utilize the format Clearance MM/DD/YY or Clearance TBD. Do not use dashes or single digits. - If request is for dental extraction, please clarify the # of teeth to be extracted.  Request for surgical clearance:  1. What type of surgery is being performed? RIGHT TOTAL HIP ARTHROPLASTY  2. When is this surgery scheduled? TBD   3. What type of clearance is required (medical clearance vs. Pharmacy clearance to hold med vs. Both)? BOTH  4. Are there any medications that need to be held prior to surgery and how long?ASPIRIN-THEY ARE REQUESTING INSTRUCTIONS   5. Practice name and name of physician performing surgery? [facility]   6. What is the office phone number? [place].   What is the office fax number? [phone]  8.   Anesthesia type (None, local, MAC, general) ? CHOICE   Fredia Beets 09/20/2019, 4:23 PM  _________________________________________________________________   (provider comments below)

## 2019-09-20 NOTE — Telephone Encounter (Signed)
   Primary Cardiologist:James Hochrein, MD  Chart reviewed as part of pre-operative protocol coverage. Patient has not been seen since 05/2017. Because of [name]'s past medical history and time since last visit, they will require a follow-up visit in order to better assess preoperative cardiovascular risk.  Pre-op covering staff: - Please schedule appointment and call patient to inform them. If patient already had an upcoming appointment within acceptable timeframe, please add "pre-op clearance" to the appointment notes so provider is aware. - Please contact requesting surgeon's office via preferred method (i.e, phone, fax) to inform them of need for appointment prior to surgery.  If applicable, this message will also be routed to pharmacy pool and/or primary cardiologist for input on holding anticoagulant/antiplatelet agent as requested below so that this information is available to the clearing provider at time of patient's appointment.   Corrin Parker, PA-C  09/20/2019, 4:48 PM

## 2019-09-22 NOTE — Telephone Encounter (Signed)
PT HAS [facility] AOOT SCHEDULED 6-23

## 2019-10-03 DIAGNOSIS — Z7189 Other specified counseling: Secondary | ICD-10-CM | POA: Insufficient documentation

## 2019-10-03 NOTE — Progress Notes (Signed)
Cardiology Office Note   Date:  10/04/2019   ID:  [name], [name] [birth year], MRN [id number]  PCP:  Derinda Late, MD  Cardiologist:   Minus Breeding, MD   No chief complaint on file.     History of Present Illness: [name] is a 64 y.o. female who presents for follow up of CAD/CABG.   Since I last saw her she has done okay from a cardiovascular standpoint. The patient denies any new symptoms such as chest discomfort, neck or arm discomfort. There has been no new shortness of breath, PND or orthopnea. There have been no reported palpitations, presyncope or syncope.  She still very physically active.  She exercises routinely. The patient denies any new symptoms such as chest discomfort, neck or arm discomfort. There has been no new shortness of breath, PND or orthopnea. There have been no reported palpitations, presyncope or syncope.   Past Medical History:  Diagnosis Date  . Anxiety   . CORONARY ARTERY DISEASE   . DYSLIPIDEMIA 02/12/2007  . GASTROESOPHAGEAL REFLUX DISEASE 02/12/2007    Past Surgical History:  Procedure Laterality Date  . CORONARY ARTERY BYPASS GRAFT  [facility] Reveal '07   2 vessel  . Laparoscopic surgery     Endometriosis   . ROTATOR CUFF REPAIR Right 01/2005  . TUBAL LIGATION  06/2001  . WISDOM TOOTH EXTRACTION       Current Outpatient Medications  Medication Sig Dispense Refill  . ALPRAZolam (XANAX) 0.5 MG tablet Take 1 tablet by mouth at bedtime. Take 1 tab daily at bedtime  0  . Ascorbic Acid (VITAMIN C) 1000 MG tablet Take 1,000 mg by mouth daily.    Marland Kitchen aspirin 81 MG tablet Take 81 mg by mouth daily. Women's aspirin w/calcium.    . calcium carbonate (OS-CAL) 600 MG tablet Take 600 mg by mouth 2 (two) times daily.    . Calcium Citrate 200 MG TABS Take 4 tablets by mouth daily.    . celecoxib (CELEBREX) 200 MG capsule Take 200 mg by mouth 2 (two) times daily.    . Cholecalciferol (VITAMIN D) 2000 units CAPS Take 1 capsule by mouth daily.      . diphenhydrAMINE (BENADRYL) 25 MG tablet Take 25 mg by mouth every 6 (six) hours as needed.    . Evolocumab (REPATHA) 140 MG/ML SOSY Inject into the skin every 14 (fourteen) days.    Marland Kitchen HYDROcodone-acetaminophen (NORCO/VICODIN) 5-325 MG tablet Take 1 tablet by mouth 2 (two) times daily as needed. Take 1 1 tab daily twice a day  0  . LYRICA 75 MG capsule TAKE ONE CAPSULE BY MOUTH EVERY MORNING AND 2 AT BEDTIME  3  . metFORMIN (GLUCOPHAGE) 500 MG tablet Take by mouth 2 (two) times daily with a meal.    . metoprolol tartrate (LOPRESSOR) 25 MG tablet Take 12.5 mg by mouth 2 (two) times daily.     Marland Kitchen omega-3 acid ethyl esters (LOVAZA) 1 G capsule Take 1 capsule by mouth 3 (three) times daily.    Marland Kitchen omeprazole (PRILOSEC) 20 MG capsule Take 20 mg by mouth daily.    . Probiotic Product (PROBIOTIC DAILY PO) Take 10 mg by mouth 2 (two) times daily.    . Zinc 50 MG CAPS Take by mouth.     No current facility-administered medications for this visit.    Allergies:   Codeine    ROS:  Please see the history of present illness.   Otherwise, review of systems are positive  for none.   All other systems are reviewed and negative.    PHYSICAL EXAM: VS:  BP 120/70   Pulse 73   Temp (!) 97.5 F (36.4 C)   Ht 5\' 3"  (1.6 m)   Wt 183 lb 3.2 oz (83.1 kg)   SpO2 95%   BMI 32.45 kg/m  , BMI Body mass index is 32.45 kg/m. GENERAL:  Well appearing NECK:  No jugular venous distention, waveform within normal limits, carotid upstroke brisk and symmetric, no bruits, no thyromegaly LUNGS:  Clear to auscultation bilaterally CHEST:  Well healed sternotomy scar. HEART:  PMI not displaced or sustained,S1 and S2 within normal limits, no S3, no S4, no clicks, no rubs, no murmurs ABD:  Flat, positive bowel sounds normal in frequency in pitch, no bruits, no rebound, no guarding, no midline pulsatile mass, no hepatomegaly, no splenomegaly EXT:  2 plus pulses throughout, no edema, no cyanosis no clubbing   EKG:  EKG is   ordered today. The ekg ordered today demonstrates sinus rhythm, rate 73, axis within normal limits, intervals within normal limits, no acute ST-T wave changes.   Recent Labs: No results found for requested labs within last 8760 hours.    Lipid Panel    Component Value Date/Time   CHOL 131 01/17/2013 0815   TRIG 120.0 01/17/2013 0815   HDL 52.90 01/17/2013 0815   CHOLHDL 2 01/17/2013 0815   VLDL 24.0 01/17/2013 0815   LDLCALC 54 01/17/2013 0815   LDLDIRECT 53.3 03/15/2012 0819      Wt Readings from Last 3 Encounters:  10/04/19 183 lb 3.2 oz (83.1 kg)  05/17/17 185 lb (83.9 kg)  06/09/16 184 lb (83.5 kg)      Other studies Reviewed: Additional studies/ records that were reviewed today include: Labs. Review of the above records demonstrates:  Please see elsewhere in the note.     ASSESSMENT AND PLAN:   CORONARY ARTERY BYPASS GRAFT, HX OF :     The patient has no new sypmtoms.  No further cardiovascular testing is indicated.  We will continue with aggressive risk reduction and meds as listed.    PREOP:   The patient is not going for a high risk surgery.   She has a very low functional level.  She has no high risk features or symptoms.  Therefore, based on these results  DYSLIPIDEMIA:    I will will obtain her outside labs and review to see if she is at target.    COVID EDUCATION:  She has had her vaccine.     Current medicines are reviewed at length with the patient today.  The patient does not have concerns regarding medicines.  The following changes have been made:  no change  Labs/ tests ordered today include: None  Orders Placed This Encounter  Procedures  . EKG 12-Lead     Disposition:   FU with me in one year.     Signed, 06/11/16, MD  10/04/2019 4:58 PM    [facility]

## 2019-10-04 ENCOUNTER — Other Ambulatory Visit: Payer: Self-pay

## 2019-10-04 ENCOUNTER — Encounter: Payer: Self-pay | Admitting: Cardiology

## 2019-10-04 ENCOUNTER — Ambulatory Visit (INDEPENDENT_AMBULATORY_CARE_PROVIDER_SITE_OTHER): Payer: Self-pay | Admitting: Cardiology

## 2019-10-04 VITALS — BP 120/70 | HR 73 | Temp 97.5°F | Ht 63.0 in | Wt 183.2 lb

## 2019-10-04 DIAGNOSIS — I2510 Atherosclerotic heart disease of native coronary artery without angina pectoris: Secondary | ICD-10-CM

## 2019-10-04 DIAGNOSIS — E785 Hyperlipidemia, unspecified: Secondary | ICD-10-CM

## 2019-10-04 DIAGNOSIS — Z7189 Other specified counseling: Secondary | ICD-10-CM

## 2019-10-04 NOTE — Patient Instructions (Signed)
Medication Instructions:  Your physician recommends that you continue on your current medications as directed. Please refer to the Current Medication list given to you today.  *If you need a refill on your cardiac medications before your next appointment, please call your pharmacy*  Lab Work: NONE  Testing/Procedures: NONE  Follow-Up: At [facility], you and your health needs are our priority.  As part of our continuing mission to provide you with exceptional heart care, we have created designated Provider Care Teams.  These Care Teams include your primary Cardiologist (physician) and Advanced Practice Providers (APPs -  Physician Assistants and Nurse Practitioners) who all work together to provide you with the care you need, when you need it.  We recommend signing up for the patient portal called "MyChart".  Sign up information is provided on this After Visit Summary.  MyChart is used to connect with patients for Virtual Visits (Telemedicine).  Patients are able to view lab/test results, encounter notes, upcoming appointments, etc.  Non-urgent messages can be sent to your provider as well.   To learn more about what you can do with MyChart, go to [email or web address]://[email or web address].com.    Your next appointment:   12 month(s)  You will receive a reminder letter in the mail two months in advance. If you don't receive a letter, please call our office to schedule the follow-up appointment.  The format for your next appointment:   In Person  Provider:   You may see James Hochrein, MD or one of the following Advanced Practice Providers on your designated Care Team:    Rhonda Barrett, PA-C  Kathryn Lawrence, DNP, ANP  Cadence Furth, NP    

## 2021-09-29 ENCOUNTER — Other Ambulatory Visit: Payer: Self-pay | Admitting: Family Medicine

## 2021-09-29 ENCOUNTER — Ambulatory Visit
Admission: RE | Admit: 2021-09-29 | Discharge: 2021-09-29 | Disposition: A | Discharge status: Home / Self Care | Payer: Self-pay | Source: Ambulatory Visit | Attending: Family Medicine | Admitting: Family Medicine

## 2021-09-29 DIAGNOSIS — W19XXXA Unspecified fall, initial encounter: Secondary | ICD-10-CM

## 2021-12-18 ENCOUNTER — Encounter (INDEPENDENT_AMBULATORY_CARE_PROVIDER_SITE_OTHER): Payer: Self-pay | Admitting: Family Medicine

## 2021-12-18 DIAGNOSIS — Z0289 Encounter for other administrative examinations: Secondary | ICD-10-CM

## 2022-01-13 ENCOUNTER — Ambulatory Visit (INDEPENDENT_AMBULATORY_CARE_PROVIDER_SITE_OTHER): Payer: Self-pay | Admitting: Family Medicine

## 2022-01-28 ENCOUNTER — Ambulatory Visit (INDEPENDENT_AMBULATORY_CARE_PROVIDER_SITE_OTHER): Payer: Self-pay | Admitting: Family Medicine
